# Patient Record
Sex: Male | Born: 1950 | Race: White | Hispanic: No | Marital: Married | Smoking: Former smoker
Health system: Southern US, Community
[De-identification: ages and names within clinical notes are randomized; demographics above are authoritative.]

## PROBLEM LIST (undated history)

## (undated) DIAGNOSIS — K5190 Ulcerative colitis, unspecified, without complications: Secondary | ICD-10-CM

## (undated) DIAGNOSIS — E119 Type 2 diabetes mellitus without complications: Secondary | ICD-10-CM

## (undated) DIAGNOSIS — K219 Gastro-esophageal reflux disease without esophagitis: Secondary | ICD-10-CM

## (undated) DIAGNOSIS — K766 Portal hypertension: Secondary | ICD-10-CM

## (undated) DIAGNOSIS — K7460 Unspecified cirrhosis of liver: Secondary | ICD-10-CM

## (undated) DIAGNOSIS — I509 Heart failure, unspecified: Secondary | ICD-10-CM

## (undated) DIAGNOSIS — I8500 Esophageal varices without bleeding: Secondary | ICD-10-CM

## (undated) HISTORY — PX: COLON SURGERY: SHX602

## (undated) HISTORY — PX: TOTAL COLECTOMY: SHX852

## (undated) HISTORY — PX: HERNIA REPAIR: SHX51

---

## 2021-04-17 DIAGNOSIS — E1165 Type 2 diabetes mellitus with hyperglycemia: Secondary | ICD-10-CM | POA: Diagnosis present

## 2021-04-17 DIAGNOSIS — N183 Chronic kidney disease, stage 3 unspecified: Secondary | ICD-10-CM | POA: Diagnosis present

## 2021-04-17 DIAGNOSIS — I10 Essential (primary) hypertension: Secondary | ICD-10-CM | POA: Diagnosis present

## 2021-04-17 DIAGNOSIS — N1831 Chronic kidney disease, stage 3a: Secondary | ICD-10-CM | POA: Diagnosis present

## 2021-04-17 DIAGNOSIS — Z8719 Personal history of other diseases of the digestive system: Secondary | ICD-10-CM | POA: Insufficient documentation

## 2021-04-17 DIAGNOSIS — E1142 Type 2 diabetes mellitus with diabetic polyneuropathy: Secondary | ICD-10-CM | POA: Diagnosis present

## 2021-06-08 ENCOUNTER — Emergency Department: Payer: Medicare Other

## 2021-06-08 ENCOUNTER — Other Ambulatory Visit: Payer: Self-pay

## 2021-06-08 ENCOUNTER — Inpatient Hospital Stay
Admission: EM | Admit: 2021-06-08 | Discharge: 2021-06-11 | DRG: 199 | Disposition: A | Discharge status: Home / Self Care | Payer: Medicare Other | Attending: Internal Medicine | Admitting: Internal Medicine

## 2021-06-08 ENCOUNTER — Inpatient Hospital Stay: Payer: Medicare Other

## 2021-06-08 DIAGNOSIS — N1831 Chronic kidney disease, stage 3a: Secondary | ICD-10-CM | POA: Diagnosis present

## 2021-06-08 DIAGNOSIS — Z794 Long term (current) use of insulin: Secondary | ICD-10-CM | POA: Diagnosis not present

## 2021-06-08 DIAGNOSIS — Z932 Ileostomy status: Secondary | ICD-10-CM

## 2021-06-08 DIAGNOSIS — J9601 Acute respiratory failure with hypoxia: Secondary | ICD-10-CM | POA: Diagnosis present

## 2021-06-08 DIAGNOSIS — E1122 Type 2 diabetes mellitus with diabetic chronic kidney disease: Secondary | ICD-10-CM | POA: Diagnosis present

## 2021-06-08 DIAGNOSIS — Z20822 Contact with and (suspected) exposure to covid-19: Secondary | ICD-10-CM | POA: Diagnosis present

## 2021-06-08 DIAGNOSIS — R0602 Shortness of breath: Secondary | ICD-10-CM | POA: Diagnosis present

## 2021-06-08 DIAGNOSIS — Z79899 Other long term (current) drug therapy: Secondary | ICD-10-CM | POA: Diagnosis not present

## 2021-06-08 DIAGNOSIS — E1165 Type 2 diabetes mellitus with hyperglycemia: Secondary | ICD-10-CM | POA: Diagnosis present

## 2021-06-08 DIAGNOSIS — E1142 Type 2 diabetes mellitus with diabetic polyneuropathy: Secondary | ICD-10-CM | POA: Diagnosis present

## 2021-06-08 DIAGNOSIS — J939 Pneumothorax, unspecified: Secondary | ICD-10-CM

## 2021-06-08 DIAGNOSIS — K5190 Ulcerative colitis, unspecified, without complications: Secondary | ICD-10-CM | POA: Diagnosis present

## 2021-06-08 DIAGNOSIS — N183 Chronic kidney disease, stage 3 unspecified: Secondary | ICD-10-CM | POA: Diagnosis present

## 2021-06-08 DIAGNOSIS — J9811 Atelectasis: Secondary | ICD-10-CM | POA: Diagnosis present

## 2021-06-08 DIAGNOSIS — J9311 Primary spontaneous pneumothorax: Principal | ICD-10-CM | POA: Diagnosis present

## 2021-06-08 DIAGNOSIS — I10 Essential (primary) hypertension: Secondary | ICD-10-CM | POA: Diagnosis present

## 2021-06-08 DIAGNOSIS — I129 Hypertensive chronic kidney disease with stage 1 through stage 4 chronic kidney disease, or unspecified chronic kidney disease: Secondary | ICD-10-CM | POA: Diagnosis present

## 2021-06-08 DIAGNOSIS — Z888 Allergy status to other drugs, medicaments and biological substances status: Secondary | ICD-10-CM | POA: Diagnosis not present

## 2021-06-08 DIAGNOSIS — D696 Thrombocytopenia, unspecified: Secondary | ICD-10-CM | POA: Diagnosis present

## 2021-06-08 DIAGNOSIS — Z91041 Radiographic dye allergy status: Secondary | ICD-10-CM

## 2021-06-08 DIAGNOSIS — J9691 Respiratory failure, unspecified with hypoxia: Secondary | ICD-10-CM

## 2021-06-08 HISTORY — DX: Heart failure, unspecified: I50.9

## 2021-06-08 HISTORY — DX: Type 2 diabetes mellitus without complications: E11.9

## 2021-06-08 LAB — CBC
HCT: 40.3 % (ref 39.0–52.0)
Hemoglobin: 14.1 g/dL (ref 13.0–17.0)
MCH: 31.9 pg (ref 26.0–34.0)
MCHC: 35 g/dL (ref 30.0–36.0)
MCV: 91.2 fL (ref 80.0–100.0)
Platelets: 123 10*3/uL — ABNORMAL LOW (ref 150–400)
RBC: 4.42 MIL/uL (ref 4.22–5.81)
RDW: 13 % (ref 11.5–15.5)
WBC: 7.4 10*3/uL (ref 4.0–10.5)
nRBC: 0 % (ref 0.0–0.2)

## 2021-06-08 LAB — BASIC METABOLIC PANEL
Anion gap: 7 (ref 5–15)
BUN: 21 mg/dL (ref 8–23)
CO2: 26 mmol/L (ref 22–32)
Calcium: 9.8 mg/dL (ref 8.9–10.3)
Chloride: 105 mmol/L (ref 98–111)
Creatinine, Ser: 1.39 mg/dL — ABNORMAL HIGH (ref 0.61–1.24)
GFR, Estimated: 55 mL/min — ABNORMAL LOW (ref >60)
Glucose, Bld: 254 mg/dL — ABNORMAL HIGH (ref 70–99)
Potassium: 4 mmol/L (ref 3.5–5.1)
Sodium: 138 mmol/L (ref 135–145)

## 2021-06-08 LAB — RESP PANEL BY RT-PCR (FLU A&B, COVID) ARPGX2
Influenza A by PCR: NEGATIVE
Influenza B by PCR: NEGATIVE
SARS Coronavirus 2 by RT PCR: NEGATIVE

## 2021-06-08 LAB — CBG MONITORING, ED
Glucose-Capillary: 133 mg/dL — ABNORMAL HIGH (ref 70–99)
Glucose-Capillary: 63 mg/dL — ABNORMAL LOW (ref 70–99)
Glucose-Capillary: 269 mg/dL — ABNORMAL HIGH (ref 70–99)

## 2021-06-08 MED ORDER — ACETAMINOPHEN 325 MG PO TABS: 650.0000 mg | ORAL_TABLET | Freq: Four times a day (QID) | ORAL | Status: DC | PRN

## 2021-06-08 MED ORDER — INSULIN ASPART 100 UNIT/ML IJ SOLN
0.0000 [IU] | Freq: Three times a day (TID) | INTRAMUSCULAR | Status: DC
  Administered 2021-06-09: 3 [IU] via SUBCUTANEOUS
  Administered 2021-06-10 (×2): 2 [IU] via SUBCUTANEOUS
  Administered 2021-06-10: 5 [IU] via SUBCUTANEOUS
  Administered 2021-06-11: 2 [IU] via SUBCUTANEOUS
  Administered 2021-06-11: 5 [IU] via SUBCUTANEOUS
  Filled 2021-06-08 (×6): qty 1

## 2021-06-08 MED ORDER — LABETALOL HCL 5 MG/ML IV SOLN: 10.0000 mg | INTRAVENOUS | Status: DC | PRN

## 2021-06-08 MED ORDER — INSULIN ASPART 100 UNIT/ML IJ SOLN
0.0000 [IU] | Freq: Every day | INTRAMUSCULAR | Status: DC
Start: 2021-06-08 — End: 2021-06-11
  Administered 2021-06-10: 2 [IU] via SUBCUTANEOUS
  Filled 2021-06-08: qty 1

## 2021-06-08 MED ORDER — MORPHINE SULFATE (PF) 2 MG/ML IV SOLN
2.0000 mg | INTRAVENOUS | Status: DC | PRN
Start: 2021-06-08 — End: 2021-06-09
  Administered 2021-06-08: 2 mg via INTRAVENOUS
  Filled 2021-06-08: qty 1

## 2021-06-08 MED ORDER — ENOXAPARIN SODIUM 40 MG/0.4ML IJ SOSY: 40.0000 mg | PREFILLED_SYRINGE | INTRAMUSCULAR | Status: DC

## 2021-06-08 MED ORDER — ACETAMINOPHEN 650 MG RE SUPP: 650.0000 mg | Freq: Four times a day (QID) | RECTAL | Status: DC | PRN

## 2021-06-08 MED ORDER — POLYETHYLENE GLYCOL 3350 17 G PO PACK: 17.0000 g | PACK | Freq: Every day | ORAL | Status: DC | PRN

## 2021-06-08 NOTE — ED Notes (Signed)
Pt with c/o feeling "shaky" and his blood sugar is low. BG checked, bg 63. Pt alert, oriented. Pt given 8 oz of orange juice and 3 packets of sugar. Will continue to assess.

## 2021-06-08 NOTE — ED Triage Notes (Signed)
Pt to ED for shob with exertion x1 week with cough. States sent by doctor for abnormal x ray. Speaking in complete sentences. Denies chest pain

## 2021-06-08 NOTE — ED Provider Notes (Signed)
[facility] Emergency Department Provider Note   ____________________________________________   Event Date/Time   First MD Initiated Contact with Patient 06/08/21 1629     (approximate)  I have reviewed the triage vital signs and the nursing notes.   HISTORY  Chief Complaint Shortness of Breath    HPI Alexander Cunningham is a 70 y.o. 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, type 2 diabetes, and hypertension          Past Medical History:  Diagnosis Date   CHF (congestive heart failure) (HCC)    Diabetes mellitus without complication (HCC)     Patient Active Problem List   Diagnosis Date Noted   Pneumothorax on right 06/08/2021   Ulcerative colitis (HCC) 06/08/2021   Respiratory failure with hypoxia (HCC) 06/08/2021   Pneumothorax 06/08/2021   Stage 3 chronic kidney disease (HCC) 04/17/2021   History of colitis 04/17/2021   Essential hypertension 04/17/2021   Type 2 diabetes mellitus with peripheral neuropathy (HCC) 04/17/2021     Prior to Admission medications   Medication Sig Start Date End Date Taking? Authorizing Provider  ferrous sulfate 325 (65 FE) MG tablet Take 325 mg by mouth every morning. 01/04/21  Yes [provider]  gabapentin (NEURONTIN) 300 MG capsule Take 300 mg by mouth 3 (three) times daily. 05/16/21  Yes [provider]  Insulin Glargine (BASAGLAR KWIKPEN) 100 UNIT/ML Inject 25 Units into the skin in the morning and at bedtime. 72 units in the am and 25 units in the pm 04/06/21  Yes [provider]  lisinopril (ZESTRIL) 5 MG tablet Take 5 mg by mouth every other day. 04/17/21  Yes [provider]  magnesium oxide (MAG-OX) 400 MG tablet Take 1 tablet by mouth 2 (two) times daily. 05/03/21  Yes [provider]  NOVOLOG FLEXPEN 100 UNIT/ML FlexPen Inject 15 Units into the skin in the morning, at noon, and at bedtime. 04/06/21  Yes [provider]  omeprazole (PRILOSEC) 40 MG capsule Take 40 mg by mouth daily. 05/26/21  Yes [provider]  propranolol (INDERAL) 10 MG tablet Take 10 mg by mouth 2 (two) times daily. 05/14/21  Yes [provider]    Allergies Contrast media [iodinated diagnostic agents] and Tricor [fenofibrate]  No family history on file.  Social History    Review of Systems Constitutional: No fever/chills Eyes: No visual changes. ENT: No sore throat. Cardiovascular: Denies chest pain. Respiratory: Endorses shortness of breath and nonproductive cough Gastrointestinal: No abdominal pain.  No nausea, no vomiting.  No diarrhea. Genitourinary: Negative for dysuria. Musculoskeletal: Negative for acute arthralgias Skin: Negative for rash. Neurological: Negative for headaches, weakness/numbness/paresthesias in any extremity Psychiatric: Negative for suicidal ideation/homicidal ideation   ____________________________________________   PHYSICAL EXAM:  VITAL SIGNS: ED Triage Vitals  Enc Vitals Group     BP 06/08/21 1426 (!) 159/77     Pulse Rate 06/08/21 1426 64     Resp  06/08/21 1426 20     Temp 06/08/21 1426 97.7 F (36.5 C)     Temp Source 06/08/21 1426 Oral     SpO2 06/08/21 1426 97 %     Weight 06/08/21 1420 175 lb (79.4 kg)     Height 06/08/21 1420 5\' 10"  (1.778 m)     Head Circumference --      Peak Flow --       Pain Score 06/08/21 1420 0     Pain Loc --      Pain Edu? --      Excl. in GC? --    Constitutional: Alert and oriented. Well appearing and in no acute distress. Eyes: Conjunctivae are normal. PERRL. Head: Atraumatic. Nose: No congestion/rhinnorhea. Mouth/Throat: Mucous membranes are moist. Neck: No stridor Cardiovascular: Grossly normal heart sounds.  Good peripheral circulation. Respiratory: Normal respiratory effort.  No retractions.  Distant breath sounds over right lower lung fields Gastrointestinal: Soft and nontender. No distention. Musculoskeletal: No obvious deformities Neurologic:  Normal speech and language. No gross focal neurologic deficits are appreciated. Skin:  Skin is warm and dry. No rash noted. Psychiatric: Mood and affect are normal. Speech and behavior are normal.  ____________________________________________   LABS (all labs ordered are listed, but only abnormal results are displayed)  Labs Reviewed  CBC - Abnormal; Notable for the following components:      Result Value   Platelets 123 (*)    All other components within normal limits  BASIC METABOLIC PANEL - Abnormal; Notable for the following components:   Glucose, Bld 254 (*)    Creatinine, Ser 1.39 (*)    GFR, Estimated 55 (*)    All other components within normal limits  CBG MONITORING, ED - Abnormal; Notable for the following components:   Glucose-Capillary 63 (*)    All other components within normal limits  CBG MONITORING, ED - Abnormal; Notable for the following components:   Glucose-Capillary 133 (*)    All other components within normal limits  CBG MONITORING, ED - Abnormal; Notable for the following components:   Glucose-Capillary 269 (*)    All other components within normal limits  RESP PANEL BY RT-PCR (FLU A&B, COVID) ARPGX2  HIV ANTIBODY (ROUTINE TESTING W REFLEX)  BASIC METABOLIC PANEL  CBC   ____________________________________________  EKG  ED ECG REPORT I, 08/08/21,  the attending physician, personally viewed and interpreted this ECG.  Date: 06/08/2021 EKG Time: 1417 Rate: 63 Rhythm: normal sinus rhythm QRS Axis: normal Intervals: normal ST/T Wave abnormalities: normal Narrative Interpretation: no evidence of acute ischemia  ____________________________________________  RADIOLOGY  ED MD interpretation: 2 view chest x-ray shows moderate right pneumothorax read as 50% without any displaced rib fracture or obvious etiology  Official radiology report(s): DG Chest 2 View  Result Date: 06/08/2021 CLINICAL DATA:  Shortness of breath EXAM: CHEST - 2 VIEW COMPARISON:  None. FINDINGS: Moderate right pneumothorax, approximately 50% volume. The left lung is normally aerated. Mild cardiomegaly. Osseous structures are unremarkable. IMPRESSION: Moderate right pneumothorax, approximately 50% volume. No displaced rib fracture or other obvious etiology. Call report request was placed at the time of interpretation. Final communication will be documented. Electronically Signed   By: 08/08/2021 M.D.   On: 06/08/2021 15:03    ____________________________________________   PROCEDURES  Procedure(s) performed (including Critical Care):  CHEST TUBE INSERTION  Date/Time: 06/08/2021 10:24 PM Performed by: 08/08/2021, MD Authorized by: Merwyn Katos, MD   Consent:    Consent obtained:  Verbal   Consent given by:  Patient   Risks, benefits, and alternatives were discussed: yes     Risks discussed:  Bleeding, damage to surrounding structures, incomplete drainage, infection, nerve damage and pain   Alternatives discussed:  No treatment, delayed treatment, alternative treatment and observation Universal protocol:    Immediately prior to procedure, a time out was called: yes     Patient identity confirmed:  Verbally with patient and arm band Pre-procedure details:    Skin preparation:  Chlorhexidine   Preparation: Patient was prepped and draped in the usual  sterile fashion   Sedation:    Sedation type:  None Anesthesia:    Anesthesia method:  Local infiltration   Local anesthetic:  Lidocaine 1% w/o epi Procedure details:    Placement location:  R lateral   Scalpel size:  11   Tube size (Fr):  16   Tension pneumothorax: no     Tube connected to:  Water seal   Drainage characteristics:  Air only   Suture material:  0 silk   Dressing:  4x4 sterile gauze and petrolatum-impregnated gauze Post-procedure details:    Post-insertion x-ray findings: tube in good position     Procedure completion:  Tolerated well, no immediate complications .1-3 Lead EKG Interpretation Performed by: Merwyn Katos, MD Authorized by: Merwyn Katos, MD     Interpretation: normal     ECG rate:  78   ECG rate assessment: normal     Rhythm: sinus rhythm     Ectopy: none     Conduction: normal     ____________________________________________   INITIAL IMPRESSION / ASSESSMENT AND PLAN / ED COURSE  As part of my medical decision making, I reviewed the following data within the electronic medical record, if available:  Nursing notes reviewed and incorporated, Labs reviewed, EKG interpreted, Old chart reviewed, Radiograph reviewed and Notes from prior ED visits reviewed and incorporated     Patient is a 70 year old male with the above-stated past medical history presents for shortness of breath as well as dyspnea on exertion Patient's finding consistent with no clinical instability and pneumothorax on the right seen on chest x-ray without any signs of midline shift that would suggest a tension pneumothorax.  Consults: Pulmonology-spoke with Dr. Meredeth Ide who recommends high flow nasal cannula as well as admission to the stepdown unit and placement of a pigtail chest tube if patient's symptoms worsen at any time as well as recommended talking to the on-call general surgeon for placement of this chest tube if necessary. General surgery-spoke with Dr. Aleen Campi who  agreed to place chest tube if necessary and will be on the case Hospitalist-I spoke to Dr. Renford Dills in internal medicine who agrees to accept this patient onto their service as long as pulmonology is following and will give recommendations.  After pulmonology was reconsulted, and the decision was made by the inpatient team to place a chest tube.  I was asked by Dr. Aleen Campi to place this tube in the emergency department.  Please refer to procedure note for full details  Dispo: Admit to medicine     ____________________________________________   FINAL CLINICAL IMPRESSION(S) / ED DIAGNOSES  Final diagnoses:  Primary spontaneous pneumothorax  SOB (shortness of breath)     ED Discharge Orders     None        Note:  This document was prepared using Dragon voice recognition software and may include unintentional dictation errors.    Merwyn Katos, MD 06/08/21  2225  

## 2021-06-08 NOTE — ED Notes (Signed)
BGL 133. Boxed lunch provided to patient by RN.

## 2021-06-08 NOTE — ED Notes (Signed)
Dr. Vicente Males at bedside, chest tube inserted by MD. CT to water seal per MD. CXR ordered. VSS.

## 2021-06-08 NOTE — ED Notes (Signed)
This RN with IV insertion x 2 without success, will get another RN to attempt.

## 2021-06-08 NOTE — ED Notes (Signed)
Per Dr Vicente Males, place pt on O2 3 L [place]. Pt tolerating well.

## 2021-06-08 NOTE — ED Notes (Signed)
ED RN's with IV attempts x 4 without success, IV team order placed.

## 2021-06-08 NOTE — H&P (Addendum)
History and Physical    Alexander Cunningham WSF:[id number] DOB: 05-Nov-1950 DOA: 06/08/2021  PCP: No primary care provider on file.   Patient coming from: Home    Chief Complaint: Shortness of breath, cough  HPI: Alexander Cunningham is a 70 y.o. male with medical history significant of diabetes type 2, hypertension, ulcerative colitis status post colectomy/ ileostomy who presented from home with complaints of worsening shortness of breath, cough.  Patient lives with his wife, ambulatory and is self dependent on daily activities.  He reports that he walks about 2 miles a day.  About a week ago, he started developing progressive shortness of breath on exertion.  Yesterday morning, he woke up with severe shortness of breath and cough.  He went to see his primary care physician.  Patient was sent to the emergency department for x-ray of the chest.  Chest x-ray done in the emergency room showed right-sided pneumothorax about 50% in size.  No history of COPD or asthma.  He was a prior smoker. Patient seen and examined at the bedside this afternoon.  He was comfortable during my evaluation.  He was not in any kind of respiratory distress.  He was on 2 L of oxygen per minute.  Wife at the bedside. He denies any worsening shortness of breath, cough during my evaluation.  He denies any fever, chills, chest pain, nausea, vomiting, diarrhea, headache, hematochezia or melena.  ED Course: Chest x-ray as above.  Remained mildly hypertensive throughout his stay in the emergency department.  Lab work showed creatinine of 1.39.  Platelets of 123.  Patient being admitted for the management of right-sided moderate pneumothorax.  Pulmonology consulted with plan for chest tube placement.  Review of Systems: As per HPI otherwise 10 point review of systems negative.    Past Medical History:  Diagnosis Date   CHF (congestive heart failure) (HCC)    Diabetes mellitus without complication (HCC)      has no history on file  for tobacco use, alcohol use, and drug use.  Allergies  Allergen Reactions   Contrast Media [Iodinated Diagnostic Agents] Rash   Tricor [Fenofibrate] Rash    No family history on file.   Prior to Admission medications   Not on File    Physical Exam: Vitals:   06/08/21 1712 06/08/21 1718 06/08/21 1718 06/08/21 1730  BP: (!) 174/79  (!) 174/79 (!) 170/80  Pulse: (!) 58 62 (!) 59 61  Resp:  16 15 (!) 21  Temp:      TempSrc:      SpO2: 100% 98% 97% 100%  Weight:      Height:        Constitutional: NAD, calm, comfortable,pleasant male Vitals:   06/08/21 1712 06/08/21 1718 06/08/21 1718 06/08/21 1730  BP: (!) 174/79  (!) 174/79 (!) 170/80  Pulse: (!) 58 62 (!) 59 61  Resp:  16 15 (!) 21  Temp:      TempSrc:      SpO2: 100% 98% 97% 100%  Weight:      Height:       Eyes: PERRL, lids and conjunctivae normal ENMT: Mucous membranes are moist.  Neck: normal, supple, no masses, no thyromegaly Respiratory: Diminished air entry on the right side, no wheezing, no crackles. Normal respiratory effort. No accessory muscle use.  Cardiovascular: Regular rate and rhythm, no murmurs / rubs / gallops. No extremity edema.  Abdomen: no tenderness, no masses palpated. No hepatosplenomegaly. Bowel sounds positive.  Ileostomy Musculoskeletal: no clubbing /  cyanosis. No joint deformity upper and lower extremities.  Skin: no rashes, lesions, ulcers. No induration Neurologic: CN 2-12 grossly intact.  Strength 5/5 in all 4.  Psychiatric: Normal judgment and insight. Alert and oriented x 3. Normal mood.   Foley Catheter:None  Labs on Admission: I have personally reviewed following labs and imaging studies  CBC: Recent Labs  Lab 06/08/21 1426  WBC 7.4  HGB 14.1  HCT 40.3  MCV 91.2  PLT 123*   Basic Metabolic Panel: Recent Labs  Lab 06/08/21 1426  NA 138  K 4.0  CL 105  CO2 26  GLUCOSE 254*  BUN 21  CREATININE 1.39*  CALCIUM 9.8   GFR: Estimated Creatinine Clearance: 51.1  mL/min (A) (by C-G formula based on SCr of 1.39 mg/dL (H)). Liver Function Tests: No results for input(s): AST, ALT, ALKPHOS, BILITOT, PROT, ALBUMIN in the last 168 hours. No results for input(s): LIPASE, AMYLASE in the last 168 hours. No results for input(s): AMMONIA in the last 168 hours. Coagulation Profile: No results for input(s): INR, PROTIME in the last 168 hours. Cardiac Enzymes: No results for input(s): CKTOTAL, CKMB, CKMBINDEX, TROPONINI in the last 168 hours. BNP (last 3 results) No results for input(s): PROBNP in the last 8760 hours. HbA1C: No results for input(s): HGBA1C in the last 72 hours. CBG: No results for input(s): GLUCAP in the last 168 hours. Lipid Profile: No results for input(s): CHOL, HDL, LDLCALC, TRIG, CHOLHDL, LDLDIRECT in the last 72 hours. Thyroid Function Tests: No results for input(s): TSH, T4TOTAL, FREET4, T3FREE, THYROIDAB in the last 72 hours. Anemia Panel: No results for input(s): VITAMINB12, FOLATE, FERRITIN, TIBC, IRON, RETICCTPCT in the last 72 hours. Urine analysis: No results found for: COLORURINE, APPEARANCEUR, LABSPEC, PHURINE, GLUCOSEU, HGBUR, BILIRUBINUR, KETONESUR, PROTEINUR, UROBILINOGEN, NITRITE, LEUKOCYTESUR  Radiological Exams on Admission: DG Chest 2 View  Result Date: 06/08/2021 CLINICAL DATA:  Shortness of breath EXAM: CHEST - 2 VIEW COMPARISON:  None. FINDINGS: Moderate right pneumothorax, approximately 50% volume. The left lung is normally aerated. Mild cardiomegaly. Osseous structures are unremarkable. IMPRESSION: Moderate right pneumothorax, approximately 50% volume. No displaced rib fracture or other obvious etiology. Call report request was placed at the time of interpretation. Final communication will be documented. Electronically Signed   By: Lauralyn Primes M.D.   On: 06/08/2021 15:03     Assessment/Plan Principal Problem:   Pneumothorax on right Active Problems:   Stage 3 chronic kidney disease (HCC)   Essential  hypertension   Type 2 diabetes mellitus with peripheral neuropathy (HCC)   Ulcerative colitis (HCC)   Respiratory failure with hypoxia (HCC)   Pneumothorax   Moderate right-sided pneumothorax: Presented with progressive worsening of shortness of breath on exertion, worsening cough. No history of COPD/emphysema.  He is a past smoker.  No active lung disease. Chest x-ray showed moderate pneumothorax approximately with 50% of volume. Needs chest tube placement.  PCCM already on board.  Planning for chest tube placement tonight.  I talked with Dr. Meredeth Ide, PCCM, and he is aware.General surgery will put the chest tube if PCCM not available for chest tube placement tonight.  Acute hypoxic respiratory failure: Secondary to pneumothorax.  Currently on 2 L of oxygen .  He is not on oxygen at home.  We will try to wean the oxygen.  Hypertension: Remains hypertensive in the emergency department.  Continue as needed medications for now.  Medications have not been reconciled yet.  We will restart the home medications after medications are reconciled.  Diabetes  type 2: Continue sliding scale insulin.  Medication reconciliation pending.  Monitor blood sugars.  Hemoglobin A1c of 7.7 as per 04/1871  History of ulcerative colitis: History of colectomy in 2016.  Currently on colostomy.  Follows with gastroenterology.  Currently in remission.  Stage IIIa CKD: Currently kidney function at baseline.  Baseline creatinine around 1.4  Thrombocytopenia: Likely chronic, upon reviewing his previous blood works.  Continue to monitor.  Stable.  Medication reconciliation for this patient has not been completed yet.  Will restart home medications after it is complete   Severity of Illness: The appropriate patient status for this patient is INPATIENT.   DVT prophylaxis: Lovenox Code Status:Full  Family Communication: Discussed with wife at bed side Consults called: PCCM(Dr Meredeth Ide), general surgery     Burnadette Pop MD [facility]  06/08/2021, 5:54 PM

## 2021-06-09 ENCOUNTER — Inpatient Hospital Stay: Payer: Medicare Other

## 2021-06-09 DIAGNOSIS — J9311 Primary spontaneous pneumothorax: Principal | ICD-10-CM

## 2021-06-09 LAB — CBC
HCT: 39.2 % (ref 39.0–52.0)
Hemoglobin: 13.4 g/dL (ref 13.0–17.0)
MCH: 31.2 pg (ref 26.0–34.0)
MCHC: 34.2 g/dL (ref 30.0–36.0)
MCV: 91.4 fL (ref 80.0–100.0)
Platelets: 97 10*3/uL — ABNORMAL LOW (ref 150–400)
RBC: 4.29 MIL/uL (ref 4.22–5.81)
RDW: 13 % (ref 11.5–15.5)
WBC: 7.4 10*3/uL (ref 4.0–10.5)
nRBC: 0 % (ref 0.0–0.2)

## 2021-06-09 LAB — CBG MONITORING, ED
Glucose-Capillary: 155 mg/dL — ABNORMAL HIGH (ref 70–99)
Glucose-Capillary: 151 mg/dL — ABNORMAL HIGH (ref 70–99)
Glucose-Capillary: 235 mg/dL — ABNORMAL HIGH (ref 70–99)
Glucose-Capillary: 278 mg/dL — ABNORMAL HIGH (ref 70–99)

## 2021-06-09 LAB — HIV ANTIBODY (ROUTINE TESTING W REFLEX): HIV Screen 4th Generation wRfx: NONREACTIVE

## 2021-06-09 LAB — BASIC METABOLIC PANEL
Anion gap: 5 (ref 5–15)
BUN: 23 mg/dL (ref 8–23)
CO2: 25 mmol/L (ref 22–32)
Calcium: 9.9 mg/dL (ref 8.9–10.3)
Chloride: 107 mmol/L (ref 98–111)
Creatinine, Ser: 1.67 mg/dL — ABNORMAL HIGH (ref 0.61–1.24)
GFR, Estimated: 44 mL/min — ABNORMAL LOW (ref >60)
Glucose, Bld: 171 mg/dL — ABNORMAL HIGH (ref 70–99)
Potassium: 4.8 mmol/L (ref 3.5–5.1)
Sodium: 137 mmol/L (ref 135–145)

## 2021-06-09 MED ORDER — GABAPENTIN 300 MG PO CAPS
300.0000 mg | ORAL_CAPSULE | Freq: Three times a day (TID) | ORAL | Status: DC
  Administered 2021-06-09 – 2021-06-10 (×3): 300 mg via ORAL
  Filled 2021-06-09 (×6): qty 1

## 2021-06-09 MED ORDER — OMEPRAZOLE 20 MG PO CPDR
40.0000 mg | DELAYED_RELEASE_CAPSULE | Freq: Every day | ORAL | Status: DC
  Filled 2021-06-09 (×2): qty 2

## 2021-06-09 MED ORDER — PROPRANOLOL HCL 10 MG PO TABS
10.0000 mg | ORAL_TABLET | Freq: Two times a day (BID) | ORAL | Status: DC
  Administered 2021-06-09 – 2021-06-11 (×5): 10 mg via ORAL
  Filled 2021-06-09 (×6): qty 1

## 2021-06-09 MED ORDER — HYDROMORPHONE HCL 1 MG/ML IJ SOLN
0.5000 mg | INTRAMUSCULAR | Status: DC | PRN
Start: 2021-06-09 — End: 2021-06-11
  Administered 2021-06-09 (×3): 0.5 mg via INTRAVENOUS
  Filled 2021-06-09 (×3): qty 1

## 2021-06-09 MED ORDER — NON FORMULARY: 40.0000 mg | Freq: Every day | Status: DC

## 2021-06-09 MED ORDER — ACETAMINOPHEN 500 MG PO TABS
1000.0000 mg | ORAL_TABLET | Freq: Four times a day (QID) | ORAL | Status: DC
  Administered 2021-06-09 – 2021-06-11 (×9): 1000 mg via ORAL
  Filled 2021-06-09 (×11): qty 2

## 2021-06-09 MED ORDER — FERROUS SULFATE 325 (65 FE) MG PO TABS
325.0000 mg | ORAL_TABLET | Freq: Every morning | ORAL | Status: DC
  Filled 2021-06-09 (×2): qty 1

## 2021-06-09 MED ORDER — PANTOPRAZOLE SODIUM 40 MG PO TBEC
40.0000 mg | DELAYED_RELEASE_TABLET | Freq: Every day | ORAL | Status: DC
  Filled 2021-06-09: qty 1

## 2021-06-09 MED ORDER — OXYCODONE HCL 5 MG PO TABS
5.0000 mg | ORAL_TABLET | ORAL | Status: DC | PRN
  Administered 2021-06-09: 5 mg via ORAL
  Administered 2021-06-10: 10 mg via ORAL
  Administered 2021-06-10 – 2021-06-11 (×3): 5 mg via ORAL
  Administered 2021-06-11: 10 mg via ORAL
  Filled 2021-06-09 (×2): qty 1
  Filled 2021-06-09 (×2): qty 2
  Filled 2021-06-09 (×2): qty 1

## 2021-06-09 MED ORDER — KETOROLAC TROMETHAMINE 30 MG/ML IJ SOLN
15.0000 mg | Freq: Four times a day (QID) | INTRAMUSCULAR | Status: DC
  Administered 2021-06-09 – 2021-06-10 (×6): 15 mg via INTRAVENOUS
  Filled 2021-06-09 (×6): qty 1

## 2021-06-09 MED ORDER — LISINOPRIL 10 MG PO TABS: 5.0000 mg | ORAL_TABLET | ORAL | Status: DC

## 2021-06-09 MED ORDER — MAGNESIUM OXIDE 400 MG PO TABS
400.0000 mg | ORAL_TABLET | Freq: Two times a day (BID) | ORAL | Status: DC
  Administered 2021-06-09 – 2021-06-10 (×4): 400 mg via ORAL
  Filled 2021-06-09 (×8): qty 1

## 2021-06-09 NOTE — ED Notes (Signed)
Patient irritated and requesting to order lunch and wondering when it will arrive. Patient expresses being upset about not having phone in room to order food. Patient given staff phone to order lunch and after speaking with dining services he stated to them " I do not think I am going to eat lunch." Patient then ordered crackers and cheese.

## 2021-06-09 NOTE — ED Notes (Signed)
Pt asleep in semi fowlers position, call light in reach. Pt does not appear to be in any apparent acute resp distress at this time.

## 2021-06-09 NOTE — Progress Notes (Signed)
PROGRESS NOTE    Alexander Cunningham  FIE:[id number] DOB: August 10, 1951 DOA: 06/08/2021 PCP: Dorothey BasemanBronstein, David, MD   Chief Complain:SOB  Brief Narrative:  Alexander Cunningham is a 70 y.o. male with medical history significant of diabetes type 2, hypertension, ulcerative colitis status post colectomy/ ileostomy who presented from home with complaints of worsening shortness of breath, cough.   he woke up a day before admission with severe shortness of breath and cough.  He went to see his primary care physician.  Patient was sent to the emergency department for x-ray of the chest.  Chest x-ray done in the emergency room showed right-sided pneumothorax about 50% in size.  Chest tube was inserted on the right side in the emergency department.  PCCM following.  Assessment & Plan:   Principal Problem:   Pneumothorax on right Active Problems:   Stage 3 chronic kidney disease (HCC)   Essential hypertension   Type 2 diabetes mellitus with peripheral neuropathy (HCC)   Ulcerative colitis (HCC)   Respiratory failure with hypoxia (HCC)   Pneumothorax   Moderate right-sided pneumothorax: Presented with progressive worsening of shortness of breath on exertion, worsening cough. No history of COPD/emphysema.  He is a past smoker.  No active lung disease. Chest x-ray showed moderate pneumothorax approximately with 50% of volume. Underwent  chest tube placement.  PCCM already on board.  Follow-up chest x-ray showed significant improvement in the pneumothorax.  Currently chest tube on suction.  Plan for follow-up chest x-ray tomorrow Auscultation revealed very good return of air entry on the right side  Acute hypoxic respiratory failure: resolved. Required  2 L of oxygen on presentation, on room air now  Hypertension: Continue his home medications.  Monitor blood pressure   Diabetes type 2: Continue sliding scale insulin.  Takes insulin at home. Hemoglobin A1c of 7.7 as per 04/17/21.  Monitor blood sugars    History of ulcerative colitis: History of colectomy in 2016.  Currently on colostomy.  Follows with gastroenterology.  Currently in remission.   Stage IIIa CKD:  Baseline creatinine around 1.4.  Creatinine bumped up today.  Check BMP tomorrow   Thrombocytopenia: Likely chronic, upon reviewing his previous blood works.  Continue to monitor.  Stable.           DVT prophylaxis:Lovenox Code Status: Full Family Communication: Wife at bedside on 06/08/21 Status is: Inpatient  Remains inpatient appropriate because:Unsafe d/c plan  Dispo: The patient is from: Home              Anticipated d/c is to: Home              Patient currently is not medically stable to d/c.   Difficult to place patient No     Consultants: General surgery, PCCM  Procedures: Chest tube placement  Antimicrobials:  Anti-infectives (From admission, onward)    None       Subjective: Patient seen and examined the bedside this morning.  Hemodynamically stable.  Chest tube was inserted last night.  Currently feels comfortable but having some pain on the chest tube insertion site.  On room air.  Denies any worsening cough or shortness of breath.  Objective: Vitals:   06/09/21 0530 06/09/21 0600 06/09/21 0730 06/09/21 0900  BP: (!) 145/76 131/67 133/67 123/78  Pulse: 65 (!) 57 (!) 55 (!) 55  Resp: 14 12 13 13   Temp:      TempSrc:      SpO2: 97% 96% 97% 99%  Weight:  Height:       No intake or output data in the 24 hours ending 06/09/21 1155 Filed Weights   06/08/21 1420  Weight: 79.4 kg    Examination:  General exam: Overall comfortable, not in distress HEENT: PERRL Respiratory system:  no wheezes or crackles , right-sided chest tube Cardiovascular system: S1 & S2 heard, RRR.  Gastrointestinal system: Abdomen is nondistended, soft and nontender. Central nervous system: Alert and oriented Extremities: No edema, no clubbing ,no cyanosis Skin: No rashes, no ulcers,no icterus     Data  Reviewed: I have personally reviewed following labs and imaging studies  CBC: Recent Labs  Lab 06/08/21 1426 06/09/21 0723  WBC 7.4 7.4  HGB 14.1 13.4  HCT 40.3 39.2  MCV 91.2 91.4  PLT 123* 97*   Basic Metabolic Panel: Recent Labs  Lab 06/08/21 1426 06/09/21 0723  NA 138 137  K 4.0 4.8  CL 105 107  CO2 26 25  GLUCOSE 254* 171*  BUN 21 23  CREATININE 1.39* 1.67*  CALCIUM 9.8 9.9   GFR: Estimated Creatinine Clearance: 42.5 mL/min (A) (by C-G formula based on SCr of 1.67 mg/dL (H)). Liver Function Tests: No results for input(s): AST, ALT, ALKPHOS, BILITOT, PROT, ALBUMIN in the last 168 hours. No results for input(s): LIPASE, AMYLASE in the last 168 hours. No results for input(s): AMMONIA in the last 168 hours. Coagulation Profile: No results for input(s): INR, PROTIME in the last 168 hours. Cardiac Enzymes: No results for input(s): CKTOTAL, CKMB, CKMBINDEX, TROPONINI in the last 168 hours. BNP (last 3 results) No results for input(s): PROBNP in the last 8760 hours. HbA1C: No results for input(s): HGBA1C in the last 72 hours. CBG: Recent Labs  Lab 06/08/21 1831 06/08/21 1917 06/08/21 2109 06/09/21 0721  GLUCAP 63* 133* 269* 155*   Lipid Profile: No results for input(s): CHOL, HDL, LDLCALC, TRIG, CHOLHDL, LDLDIRECT in the last 72 hours. Thyroid Function Tests: No results for input(s): TSH, T4TOTAL, FREET4, T3FREE, THYROIDAB in the last 72 hours. Anemia Panel: No results for input(s): VITAMINB12, FOLATE, FERRITIN, TIBC, IRON, RETICCTPCT in the last 72 hours. Sepsis Labs: No results for input(s): PROCALCITON, LATICACIDVEN in the last 168 hours.  Recent Results (from the past 240 hour(s))  Resp Panel by RT-PCR (Flu A&B, Covid) Nasopharyngeal Swab     Status: None   Collection Time: 06/08/21  5:28 PM   Specimen: Nasopharyngeal Swab; Nasopharyngeal(NP) swabs in vial transport medium  Result Value Ref Range Status   SARS Coronavirus 2 by RT PCR NEGATIVE NEGATIVE  Final    Comment: (NOTE) SARS-CoV-2 target nucleic acids are NOT DETECTED.  The SARS-CoV-2 RNA is generally detectable in upper respiratory specimens during the acute phase of infection. The lowest concentration of SARS-CoV-2 viral copies this assay can detect is 138 copies/mL. A negative result does not preclude SARS-Cov-2 infection and should not be used as the sole basis for treatment or other patient management decisions. A negative result may occur with  improper specimen collection/handling, submission of specimen other than nasopharyngeal swab, presence of viral mutation(s) within the areas targeted by this assay, and inadequate number of viral copies(<138 copies/mL). A negative result must be combined with clinical observations, patient history, and epidemiological information. The expected result is Negative.  Fact Sheet for Patients:  BloggerCourse.com  Fact Sheet for Healthcare Providers:  SeriousBroker.it  This test is no t yet approved or cleared by the [place] FDA and  has been authorized for detection and/or diagnosis of SARS-CoV-2 by  FDA under an Emergency Use Authorization (EUA). This EUA will remain  in effect (meaning this test can be used) for the duration of the COVID-19 declaration under Section 564(b)(1) of the Act, 21 U.S.C.section 360bbb-3(b)(1), unless the authorization is terminated  or revoked sooner.       Influenza A by PCR NEGATIVE NEGATIVE Final   Influenza B by PCR NEGATIVE NEGATIVE Final    Comment: (NOTE) The Xpert Xpress SARS-CoV-2/FLU/RSV plus assay is intended as an aid in the diagnosis of influenza from Nasopharyngeal swab specimens and should not be used as a sole basis for treatment. Nasal washings and aspirates are unacceptable for Xpert Xpress SARS-CoV-2/FLU/RSV testing.  Fact Sheet for Patients: BloggerCourse.com  Fact Sheet for Healthcare  Providers: SeriousBroker.it  This test is not yet approved or cleared by the [place] FDA and has been authorized for detection and/or diagnosis of SARS-CoV-2 by FDA under an Emergency Use Authorization (EUA). This EUA will remain in effect (meaning this test can be used) for the duration of the COVID-19 declaration under Section 564(b)(1) of the Act, 21 U.S.C. section 360bbb-3(b)(1), unless the authorization is terminated or revoked.  Performed at [facility], [address], [place], [place] [address]          Radiology Studies: DG Chest 2 View  Result Date: 06/08/2021 CLINICAL DATA:  Shortness of breath EXAM: CHEST - 2 VIEW COMPARISON:  None. FINDINGS: Moderate right pneumothorax, approximately 50% volume. The left lung is normally aerated. Mild cardiomegaly. Osseous structures are unremarkable. IMPRESSION: Moderate right pneumothorax, approximately 50% volume. No displaced rib fracture or other obvious etiology. Call report request was placed at the time of interpretation. Final communication will be documented. Electronically Signed   By: Lauralyn Primes M.D.   On: 06/08/2021 15:03   DG Chest Portable 1 View  Result Date: 06/09/2021 CLINICAL DATA:  Increasing shortness of breath. EXAM: PORTABLE CHEST 1 VIEW COMPARISON:  Radiograph yesterday. FINDINGS: Unchanged positioning of right pigtail catheter coiled at the right lung base laterally. Small residual pneumothorax is now visualized at the apex under the second rib posteriorly. Slight increase in right lung base atelectasis. The exam is otherwise unchanged. Stable heart size and mediastinal contours. IMPRESSION: 1. Unchanged positioning of right pigtail catheter. Small residual right pneumothorax is now visualized at the apex. 2. Slight increase in right lung base atelectasis. Electronically Signed   By: Narda Rutherford M.D.   On: 06/09/2021 01:47   DG Chest Portable 1 View  Result Date:  06/08/2021 CLINICAL DATA:  Chest tube EXAM: PORTABLE CHEST 1 VIEW COMPARISON:  06/08/2021 FINDINGS: Interval placement of right chest tube and re-expansion of the right lung. No visible residual pneumothorax. Bibasilar atelectasis. Heart is normal size. No acute bony abnormality. IMPRESSION: Interval placement of right chest tube with re-expansion of the right lung. No visible residual pneumothorax. Bibasilar atelectasis. Electronically Signed   By: Charlett Nose M.D.   On: 06/08/2021 22:51        Scheduled Meds:  acetaminophen  1,000 mg Oral Q6H   ferrous sulfate  325 mg Oral q morning   gabapentin  300 mg Oral TID   insulin aspart  0-5 Units Subcutaneous QHS   insulin aspart  0-9 Units Subcutaneous TID WC   ketorolac  15 mg Intravenous Q6H   [START ON 06/10/2021] lisinopril  5 mg Oral QODAY   magnesium oxide  400 mg Oral BID   pantoprazole  40 mg Oral Daily   propranolol  10 mg Oral BID  Continuous Infusions:   LOS: 1 day    Time spent: 35 mins,More than 50% of that time was spent in counseling and/or coordination of care.      Burnadette Pop, MD [facility] P9/06/2021, 11:55 AM

## 2021-06-09 NOTE — ED Notes (Signed)
CBG 155 

## 2021-06-09 NOTE — Consult Note (Signed)
Pulmonary Medicine          Date: 06/09/2021,   MRN# [id number] Alexander Cunningham [birth year]     AdmissionWeight: 79.4 kg                 CurrentWeight: 79.4 kg   Referring physician: Dr Renford Dills   CHIEF COMPLAINT:   Moderate to large pneumothorax with midline shift.    HISTORY OF PRESENT ILLNESS   70 yo M with hx of UC s/p surgery with colostomy, HTN, DM,  complaints of SOB and cough, he was seen by Dr Terance Hart and had CXR done showing pneumothorax with >50% in size and midline shift with tension physiology radiographically.  I reviewed the films with radiologist Dr Grace Isaac and we asked patient to come in for decompression. He is stable and is s/p chest tube placement now.  He had surgery team come and evaluate him with placement of pigtail catheter. We discussed natural history of pneumothorax and chest tube management. Atrium with few cc of serosang no air leak noted throughout respiratory cycle with forced expiratory maneuver.      PAST MEDICAL HISTORY   Past Medical History:  Diagnosis Date   CHF (congestive heart failure) (HCC)    Diabetes mellitus without complication (HCC)      SURGICAL HISTORY   Colostomy done 2022  FAMILY HISTORY   No family history on file.   SOCIAL HISTORY    Denies smoking or illicit drug use or alcohol abuse   MEDICATIONS    Home Medication:  Current Outpatient Rx   Order #: [id number] Class: Historical Med   Order #: [id number] Class: Historical Med   Order #: [id number] Class: Historical Med   Order #: [id number] Class: Historical Med   Order #: [id number] Class: Historical Med   Order #: [id number] Class: Historical Med   Order #: [id number] Class: Historical Med   Order #: [id number] Class: Historical Med    Current Medication:  Current Facility-Administered Medications:    acetaminophen (TYLENOL) tablet 1,000 mg, 1,000 mg, Oral, Q6H, Piscoya, Jose, MD, 1,000 mg at 06/09/21 0615   HYDROmorphone (DILAUDID) injection 0.5 mg,  0.5 mg, Intravenous, Q4H PRN, Piscoya, Jose, MD, 0.5 mg at 06/09/21 0504   insulin aspart (novoLOG) injection 0-5 Units, 0-5 Units, Subcutaneous, QHS, Adhikari, Amrit, MD   insulin aspart (novoLOG) injection 0-9 Units, 0-9 Units, Subcutaneous, TID WC, Adhikari, Amrit, MD   ketorolac (TORADOL) 30 MG/ML injection 15 mg, 15 mg, Intravenous, Q6H, Piscoya, Jose, MD, 15 mg at 06/09/21 0504   labetalol (NORMODYNE) injection 10 mg, 10 mg, Intravenous, Q2H PRN, Adhikari, Amrit, MD   oxyCODONE (Oxy IR/ROXICODONE) immediate release tablet 5-10 mg, 5-10 mg, Oral, Q4H PRN, Piscoya, Jose, MD   polyethylene glycol (MIRALAX / GLYCOLAX) packet 17 g, 17 g, Oral, Daily PRN, Burnadette Pop, MD  Current Outpatient Medications:    ferrous sulfate 325 (65 FE) MG tablet, Take 325 mg by mouth every morning., Disp: , Rfl:    gabapentin (NEURONTIN) 300 MG capsule, Take 300 mg by mouth 3 (three) times daily., Disp: , Rfl:    Insulin Glargine (BASAGLAR KWIKPEN) 100 UNIT/ML, Inject 25 Units into the skin in the morning and at bedtime. 72 units in the am and 25 units in the pm, Disp: , Rfl:    lisinopril (ZESTRIL) 5 MG tablet, Take 5 mg by mouth every other day., Disp: , Rfl:    magnesium oxide (MAG-OX) 400 MG tablet, Take 1 tablet by mouth 2 (two) times daily., Disp: , Rfl:  NOVOLOG FLEXPEN 100 UNIT/ML FlexPen, Inject 15 Units into the skin in the morning, at noon, and at bedtime., Disp: , Rfl:    omeprazole (PRILOSEC) 40 MG capsule, Take 40 mg by mouth daily., Disp: , Rfl:    propranolol (INDERAL) 10 MG tablet, Take 10 mg by mouth 2 (two) times daily., Disp: , Rfl:     ALLERGIES   Contrast media [iodinated diagnostic agents] and Tricor [fenofibrate]     REVIEW OF SYSTEMS    Review of Systems:  Gen:  Denies  fever, sweats, chills weigh loss  HEENT: Denies blurred vision, double vision, ear pain, eye pain, hearing loss, nose bleeds, sore throat Cardiac:  No dizziness, chest pain or heaviness, chest  tightness,edema Resp:   Denies cough or sputum porduction, shortness of breath,wheezing, hemoptysis,  Gi: Denies swallowing difficulty, stomach pain, nausea or vomiting, diarrhea, constipation, bowel incontinence Gu:  Denies bladder incontinence, burning urine Ext:   Denies Joint pain, stiffness or swelling Skin: Denies  skin rash, easy bruising or bleeding or hives Endoc:  Denies polyuria, polydipsia , polyphagia or weight change Psych:   Denies depression, insomnia or hallucinations   Other:  All other systems negative   VS: BP 133/67   Pulse (!) 55   Temp 97.7 F (36.5 C) (Oral)   Resp 13   Ht 5\' 10"  (1.778 m)   Wt 79.4 kg   SpO2 97%   BMI 25.11 kg/m      PHYSICAL EXAM    GENERAL:NAD, no fevers, chills, no weakness no fatigue HEAD: Normocephalic, atraumatic.  EYES: Pupils equal, round, reactive to light. Extraocular muscles intact. No scleral icterus.  MOUTH: Moist mucosal membrane. Dentition intact. No abscess noted.  EAR, NOSE, THROAT: Clear without exudates. No external lesions.  NECK: Supple. No thyromegaly. No nodules. No JVD.  PULMONARY: Mild rhonchi at right base posteriorly CARDIOVASCULAR: S1 and S2. Regular rate and rhythm. No murmurs, rubs, or gallops. No edema. Pedal pulses 2+ bilaterally.  GASTROINTESTINAL: Soft, nontender, nondistended. No masses. Positive bowel sounds. No hepatosplenomegaly.  MUSCULOSKELETAL: No swelling, clubbing, or edema. Range of motion full in all extremities.  NEUROLOGIC: Cranial nerves II through XII are intact. No gross focal neurological deficits. Sensation intact. Reflexes intact.  SKIN: No ulceration, lesions, rashes, or cyanosis. Skin warm and dry. Turgor intact.  PSYCHIATRIC: Mood, affect within normal limits. The patient is awake, alert and oriented x 3. Insight, judgment intact.       IMAGING    DG Chest 2 View  Result Date: 06/08/2021 CLINICAL DATA:  Shortness of breath EXAM: CHEST - 2 VIEW COMPARISON:  None. FINDINGS:  Moderate right pneumothorax, approximately 50% volume. The left lung is normally aerated. Mild cardiomegaly. Osseous structures are unremarkable. IMPRESSION: Moderate right pneumothorax, approximately 50% volume. No displaced rib fracture or other obvious etiology. Call report request was placed at the time of interpretation. Final communication will be documented. Electronically Signed   By: 08/08/2021 M.D.   On: 06/08/2021 15:03   DG Chest Portable 1 View  Result Date: 06/09/2021 CLINICAL DATA:  Increasing shortness of breath. EXAM: PORTABLE CHEST 1 VIEW COMPARISON:  Radiograph yesterday. FINDINGS: Unchanged positioning of right pigtail catheter coiled at the right lung base laterally. Small residual pneumothorax is now visualized at the apex under the second rib posteriorly. Slight increase in right lung base atelectasis. The exam is otherwise unchanged. Stable heart size and mediastinal contours. IMPRESSION: 1. Unchanged positioning of right pigtail catheter. Small residual right pneumothorax is now visualized at  the apex. 2. Slight increase in right lung base atelectasis. Electronically Signed   By: Narda Rutherford M.D.   On: 06/09/2021 01:47   DG Chest Portable 1 View  Result Date: 06/08/2021 CLINICAL DATA:  Chest tube EXAM: PORTABLE CHEST 1 VIEW COMPARISON:  06/08/2021 FINDINGS: Interval placement of right chest tube and re-expansion of the right lung. No visible residual pneumothorax. Bibasilar atelectasis. Heart is normal size. No acute bony abnormality. IMPRESSION: Interval placement of right chest tube with re-expansion of the right lung. No visible residual pneumothorax. Bibasilar atelectasis. Electronically Signed   By: Charlett Nose M.D.   On: 06/08/2021 22:51      ASSESSMENT/PLAN   Large spontaneous pneumothroax with tension physiology S/p surgery evaluation with chest tube placement - appreciate recommendations       Tree was not secured it is now taped down with tubing and also  provided omental tag for safety in case he turns on side while sleeping     - Chest tube management - no air leak noted     - will peripherally monitor as patient is stable now and surgical team is on board - appreciate recommendations./    Thank you for allowing me to participate in the care of this patient.  Total face to face encounter time for this patient visit was >45 min. >50% of the time was  spent in counseling and coordination of care.   Patient/Family are satisfied with care plan and all questions have been answered.  This document was prepared using Dragon voice recognition software and may include unintentional dictation errors.     Vida Rigger, M.D.  Division of Pulmonary & Critical Care Medicine  [facility]

## 2021-06-09 NOTE — ED Notes (Signed)
Pt assisted to side of bed with 2 RN's to empty colostomy bag & attempt to void. Unable to void at this time. 150cc of liquid stool from colostomy bag.  Gown changed, dressing reinforced. Call light in reach, given TV remote.

## 2021-06-09 NOTE — ED Notes (Signed)
MD at bedside to assess.

## 2021-06-09 NOTE — ED Notes (Signed)
Pt very upset that his home meds weren't restarted. This RN messaged MD, MD will restart meds per Dr. Renford Dills.

## 2021-06-09 NOTE — ED Notes (Signed)
Patients wife reports most of his medications are given by capsule. Pharmacy made aware to go ahead and dispense medication

## 2021-06-09 NOTE — Consult Note (Signed)
[facility] SURGICAL CONSULTATION NOTE (initial) - cpt: [id number]   HISTORY OF PRESENT ILLNESS (HPI):  70 y.o. male presented to [facility] ED overnight for evaluation of SOB. Patient reports around a 1 week history of progressive SOB on exertion. He typically walks 2 miles a day but this had become increasingly more difficult for him. On the morning of presentation, he woke up with severe SOB and a cough. No fever, chills, abdominal pain, nausea, emesis, urinary changes, or bowel changes. No trauma prior to the onset of symptoms. He does have a 30 ppy smoking history, but currently does not smoke, quit 15 years ago. He went to see his PCP for this but was referred to the ED for evaluation. Laboratory work up in the ED showed sCr elevation to 1.39 but was otherwise reassuring. He did undergo CXR which was concerning for right sided pneumothorax. Chest tube ws ultimately placed by EDP with good re-expansion of his lung.He was admitted to the medicine service.   Surgery is consulted by emergency medicine  physician Dr. Donna Bernard, MD in this context for chest tube management in setting of right spontaneous pneumothorax.  PAST MEDICAL HISTORY (PMH):  Past Medical History:  Diagnosis Date   CHF (congestive heart failure) (HCC)    Diabetes mellitus without complication (HCC)      PAST SURGICAL HISTORY (PSH):  Colectomy / Ileostomy  MEDICATIONS:  Prior to Admission medications   Medication Sig Start Date End Date Taking? Authorizing Provider  ferrous sulfate 325 (65 FE) MG tablet Take 325 mg by mouth every morning. 01/04/21  Yes [provider]  gabapentin (NEURONTIN) 300 MG capsule Take 300 mg by mouth 3 (three) times daily. 05/16/21  Yes [provider]  Insulin Glargine (BASAGLAR KWIKPEN) 100 UNIT/ML Inject 25 Units into the skin in the morning and at bedtime. 72 units in the am and 25 units in the pm 04/06/21  Yes [provider]  lisinopril (ZESTRIL) 5 MG tablet  Take 5 mg by mouth every other day. 04/17/21  Yes [provider]  magnesium oxide (MAG-OX) 400 MG tablet Take 1 tablet by mouth 2 (two) times daily. 05/03/21  Yes [provider]  NOVOLOG FLEXPEN 100 UNIT/ML FlexPen Inject 15 Units into the skin in the morning, at noon, and at bedtime. 04/06/21  Yes [provider]  omeprazole (PRILOSEC) 40 MG capsule Take 40 mg by mouth daily. 05/26/21  Yes [provider]  propranolol (INDERAL) 10 MG tablet Take 10 mg by mouth 2 (two) times daily. 05/14/21  Yes [provider]     ALLERGIES:  Allergies  Allergen Reactions   Contrast Media [Iodinated Diagnostic Agents] Rash   Tricor [Fenofibrate] Rash     SOCIAL HISTORY:  Social History   Socioeconomic History   Marital status: Married    Spouse name: Not on file   Number of children: Not on file   Years of education: Not on file   Highest education level: Not on file  Occupational History   Not on file  Tobacco Use   Smoking status: Not on file   Smokeless tobacco: Not on file  Substance and Sexual Activity   Alcohol use: Not on file   Drug use: Not on file   Sexual activity: Not on file  Other Topics Concern   Not on file  Social History Narrative   Not on file   Social Determinants of Health   Financial Resource Strain: Not on file  Food Insecurity:  Not on file  Transportation Needs: Not on file  Physical Activity: Not on file  Stress: Not on file  Social Connections: Not on file  Intimate Partner Violence: Not on file     FAMILY HISTORY:  No family history on file.    REVIEW OF SYSTEMS:  Review of Systems  Constitutional:  Negative for chills and fever.  HENT:  Negative for congestion and sore throat.   Respiratory:  Positive for cough and shortness of breath.   Cardiovascular:  Negative for chest pain and palpitations.  Gastrointestinal:  Negative for abdominal pain, nausea and vomiting.  Genitourinary:  Negative for dysuria and  urgency.  All other systems reviewed and are negative.  VITAL SIGNS:  Temp:  [97.7 F (36.5 C)] 97.7 F (36.5 C) (09/08 1426) Pulse Rate:  [57-77] 57 (09/09 0600) Resp:  [12-21] 12 (09/09 0600) BP: (131-188)/(64-87) 131/67 (09/09 0600) SpO2:  [96 %-100 %] 96 % (09/09 0600) Weight:  [79.4 kg] 79.4 kg (09/08 1420)     Height: 5\' 10"  (177.8 cm) Weight: 79.4 kg BMI (Calculated): 25.11   INTAKE/OUTPUT:  No intake/output data recorded.  PHYSICAL EXAM:  Physical Exam Vitals and nursing note reviewed.  Constitutional:      General: He is not in acute distress.    Appearance: He is well-developed and normal weight. He is not ill-appearing.  HENT:     Head: Normocephalic and atraumatic.  Eyes:     Extraocular Movements: Extraocular movements intact.     Pupils: Pupils are equal, round, and reactive to light.  Cardiovascular:     Rate and Rhythm: Normal rate and regular rhythm.     Heart sounds: No murmur heard. Pulmonary:     Effort: Pulmonary effort is normal.     Breath sounds: Normal breath sounds. No decreased breath sounds or wheezing.  Chest:    Abdominal:     General: The ostomy site is clean. There is no distension.     Palpations: Abdomen is soft.     Comments: Ileostomy present in RLQ  Skin:    General: Skin is warm and dry.     Coloration: Skin is not pale.     Findings: No erythema.  Neurological:     General: No focal deficit present.     Mental Status: He is alert and oriented to person, place, and time.  Psychiatric:        Mood and Affect: Mood normal.        Behavior: Behavior normal.     Labs:  CBC Latest Ref Rng & Units 06/08/2021  WBC 4.0 - 10.5 K/uL 7.4  Hemoglobin 13.0 - 17.0 g/dL 08/08/2021  Hematocrit 40.1 - 52.0 % 40.3  Platelets 150 - 400 K/uL 123(L)   CMP Latest Ref Rng & Units 06/08/2021  Glucose 70 - 99 mg/dL 08/08/2021)  BUN 8 - 23 mg/dL 21  Creatinine 253(G - 6.44 mg/dL 0.34)  Sodium 7.42(V - 956 mmol/L 138  Potassium 3.5 - 5.1 mmol/L 4.0   Chloride 98 - 111 mmol/L 105  CO2 22 - 32 mmol/L 26  Calcium 8.9 - 10.3 mg/dL 9.8     Imaging studies:   CXR (06/08/2021 - 1500) personally reviewed with right sided pneumothorax, and radiologist report reviewed below:  IMPRESSION: Moderate right pneumothorax, approximately 50% volume. No displaced rib fracture or other obvious etiology.   Call report request was placed at the time of interpretation. Final communication will be documented.   CXR (06/08/2021 - 2251) personally  reviewed showing newly placed right sided chest tube and improvement in pneumothorax, and radiologist report reviewed below:  IMPRESSION: Interval placement of right chest tube with re-expansion of the right lung. No visible residual pneumothorax.   Bibasilar atelectasis.  CXR (06/09/2021 - 0145) personally reviewed with stable chest tube, small apical right PTX, and radiologist report reviewed below:  IMPRESSION: 1. Unchanged positioning of right pigtail catheter. Small residual right pneumothorax is now visualized at the apex. 2. Slight increase in right lung base atelectasis.   Assessment/Plan: (ICD-10's: J93.9) 70 y.o. male with right sided spontaneous pneumothorax, suspect secondary to ruptured bleb   - Appreciate medicine admission - Recommend continuing chest tube to -20 cm suction today - Repeat CXR in the AM. IF lung remains inflated without air leuk, can consider going to water seal tomorrow   - Aggressive pulmonary toilet; Incentive spirometer use   - Pain control prn - Okay to ambulate; chest tube can be taken off suction to walk   - Further management per primary service; we will follow  All of the above findings and recommendations were discussed with the patient, and all of his questions were answered to his expressed satisfaction.  Thank you for the opportunity to participate in this patient's care.   -- Lynden Oxford, PA-C [facility] 06/09/2021, 7:36  AM [phone] M-F: 7am - 4pm

## 2021-06-09 NOTE — ED Notes (Signed)
Report given to oncoming RN all questions answered

## 2021-06-09 NOTE — ED Notes (Addendum)
This RN at bedside, pt noted to be calling out in pain. He states he reached for water at bedside then had 10/10 pain between shoulders & on L chest.    MD messaged immediately, pt repositioned. Noted to have audible air bubbles in line. Connections checked. No obvious disconnect.    New orders from on call surgeon noted.

## 2021-06-09 NOTE — ED Notes (Signed)
Order placed for prilosec to be given to patient. Pharmacy confirmed having medication. MD Amrit made aware and approved. Patient now relaying he cannot take capsules which is all pharmacy has per [facility]. Patient reports he is calling wife to bring his prilosec from home. Patient given hospital phone to use.

## 2021-06-10 ENCOUNTER — Inpatient Hospital Stay: Payer: Medicare Other

## 2021-06-10 ENCOUNTER — Encounter: Payer: Self-pay | Admitting: Internal Medicine

## 2021-06-10 LAB — CBC WITH DIFFERENTIAL/PLATELET
Abs Immature Granulocytes: 0.01 10*3/uL (ref 0.00–0.07)
Basophils Absolute: 0 10*3/uL (ref 0.0–0.1)
Basophils Relative: 1 %
Eosinophils Absolute: 0.2 10*3/uL (ref 0.0–0.5)
Eosinophils Relative: 4 %
HCT: 35 % — ABNORMAL LOW (ref 39.0–52.0)
Hemoglobin: 12.1 g/dL — ABNORMAL LOW (ref 13.0–17.0)
Immature Granulocytes: 0 %
Lymphocytes Relative: 32 %
Lymphs Abs: 1.9 10*3/uL (ref 0.7–4.0)
MCH: 31.2 pg (ref 26.0–34.0)
MCHC: 34.6 g/dL (ref 30.0–36.0)
MCV: 90.2 fL (ref 80.0–100.0)
Monocytes Absolute: 0.5 10*3/uL (ref 0.1–1.0)
Monocytes Relative: 8 %
Neutro Abs: 3.3 10*3/uL (ref 1.7–7.7)
Neutrophils Relative %: 55 %
Platelets: 83 10*3/uL — ABNORMAL LOW (ref 150–400)
RBC: 3.88 MIL/uL — ABNORMAL LOW (ref 4.22–5.81)
RDW: 12.7 % (ref 11.5–15.5)
WBC: 6 10*3/uL (ref 4.0–10.5)
nRBC: 0 % (ref 0.0–0.2)

## 2021-06-10 LAB — GLUCOSE, CAPILLARY
Glucose-Capillary: 194 mg/dL — ABNORMAL HIGH (ref 70–99)
Glucose-Capillary: 196 mg/dL — ABNORMAL HIGH (ref 70–99)
Glucose-Capillary: 296 mg/dL — ABNORMAL HIGH (ref 70–99)
Glucose-Capillary: 242 mg/dL — ABNORMAL HIGH (ref 70–99)

## 2021-06-10 LAB — BASIC METABOLIC PANEL
Anion gap: 6 (ref 5–15)
BUN: 39 mg/dL — ABNORMAL HIGH (ref 8–23)
CO2: 23 mmol/L (ref 22–32)
Calcium: 9.2 mg/dL (ref 8.9–10.3)
Chloride: 109 mmol/L (ref 98–111)
Creatinine, Ser: 2.17 mg/dL — ABNORMAL HIGH (ref 0.61–1.24)
GFR, Estimated: 32 mL/min — ABNORMAL LOW (ref >60)
Glucose, Bld: 213 mg/dL — ABNORMAL HIGH (ref 70–99)
Potassium: 4 mmol/L (ref 3.5–5.1)
Sodium: 138 mmol/L (ref 135–145)

## 2021-06-10 MED ORDER — SODIUM CHLORIDE 0.9 % IV SOLN: INTRAVENOUS | Status: DC

## 2021-06-10 MED ORDER — MAGNESIUM OXIDE -MG SUPPLEMENT 400 (240 MG) MG PO TABS
400.0000 mg | ORAL_TABLET | Freq: Two times a day (BID) | ORAL | Status: DC
  Administered 2021-06-11: 400 mg via ORAL
  Filled 2021-06-10: qty 1

## 2021-06-10 MED ORDER — PANTOPRAZOLE SODIUM 40 MG PO TBEC
80.0000 mg | DELAYED_RELEASE_TABLET | Freq: Every day | ORAL | Status: DC
  Administered 2021-06-10 – 2021-06-11 (×2): 80 mg via ORAL
  Filled 2021-06-10 (×2): qty 2

## 2021-06-10 NOTE — TOC CM/SW Note (Signed)
CSW completed chart review.  No TOC needs identified. Please place TOC consult if needs arise.  Linwood Gullikson, LCSW [phone]  

## 2021-06-10 NOTE — Progress Notes (Signed)
PROGRESS NOTE    Alexander Cunningham  YKD:[id number] DOB: 04-04-1951 DOA: 06/08/2021 PCP: Dorothey Baseman, MD   Chief Complain:SOB  Brief Narrative:  Alexander Cunningham is a 70 y.o. male with medical history significant of diabetes type 2, hypertension, ulcerative colitis status post colectomy/ ileostomy who presented from home with complaints of worsening shortness of breath, cough.   he woke up a day before admission with severe shortness of breath and cough.  He went to see his primary care physician.  Patient was sent to the emergency department for x-ray of the chest.  Chest x-ray done in the emergency room showed right-sided pneumothorax about 50% in size.  Chest tube was inserted on the right side in the emergency department.  General surgery following.  Assessment & Plan:   Principal Problem:   Pneumothorax on right Active Problems:   Stage 3 chronic kidney disease (HCC)   Essential hypertension   Type 2 diabetes mellitus with peripheral neuropathy (HCC)   Ulcerative colitis (HCC)   Respiratory failure with hypoxia (HCC)   Pneumothorax   Moderate right-sided pneumothorax: Presented with progressive worsening of shortness of breath on exertion, worsening cough. No history of COPD/emphysema.  He is a past smoker.  No active lung disease. Chest x-ray showed moderate pneumothorax approximately with 50% of volume. Underwent  chest tube placement.  General surgery following.  Follow-up chest x-ray today showed resolution of pneumothorax.  Currently chest tube on waterseal.  Plan for follow-up chest x-ray tomorrow Auscultation revealed very good return of air entry on the right side  Acute hypoxic respiratory failure: resolved. Required  2 L of oxygen on presentation, on room air now  Hypertension: Continue his home medications.  Monitor blood pressure   Diabetes type 2: Continue sliding scale insulin.  Takes insulin at home. Hemoglobin A1c of 7.7 as per 04/17/21.  Monitor blood  sugars   History of ulcerative colitis: History of colectomy in 2016.  Currently on colostomy.  Follows with gastroenterology.  Currently in remission.   Stage IIIa CKD:  Baseline creatinine around 1.4.  Creatinine bumped up today to 2.  Check BMP tomorrow   Thrombocytopenia: Likely chronic, upon reviewing his previous blood works.  Continue to monitor.  Stable.           DVT prophylaxis:SCD Code Status: Full Family Communication: Wife at bedside on 06/10/21 Status is: Inpatient  Remains inpatient appropriate because:Unsafe d/c plan  Dispo: The patient is from: Home              Anticipated d/c is to: Home tomorrow              Patient currently is not medically stable to d/c.   Difficult to place patient No     Consultants: General surgery, PCCM  Procedures: Chest tube placement  Antimicrobials:  Anti-infectives (From admission, onward)    None       Subjective: Patient seen and examined the bedside this morning.  Hemodynamically stable.  Comfortable.  Denies new complaints today.  No worsening cough or shortness of breath.  Objective: Vitals:   06/09/21 2327 06/10/21 0010 06/10/21 0528 06/10/21 0843  BP:  (!) 165/67 138/61 140/63  Pulse:  (!) 52 62 (!) 56  Resp:  16 16 18   Temp:  97.8 F (36.6 C) 97.6 F (36.4 C) 97.9 F (36.6 C)  TempSrc:  Oral Oral Oral  SpO2: 97% 100% 100% 98%  Weight:  80 kg    Height:  5\' 11"  (1.803 m)  Intake/Output Summary (Last 24 hours) at 06/10/2021 1133 Last data filed at 06/10/2021 1034 Gross per 24 hour  Intake 240 ml  Output 880 ml  Net -640 ml   Filed Weights   06/08/21 1420 06/10/21 0010  Weight: 79.4 kg 80 kg    Examination:  General exam: Overall comfortable, not in distress HEENT: PERRL Respiratory system:  no wheezes or crackles , right-sided chest tube Cardiovascular system: S1 & S2 heard, RRR.  Gastrointestinal system: Abdomen is nondistended, soft and nontender. Central nervous system: Alert and  oriented Extremities: No edema, no clubbing ,no cyanosis Skin: No rashes, no ulcers,no icterus     Data Reviewed: I have personally reviewed following labs and imaging studies  CBC: Recent Labs  Lab 06/08/21 1426 06/09/21 0723 06/10/21 1022  WBC 7.4 7.4 6.0  NEUTROABS  --   --  3.3  HGB 14.1 13.4 12.1*  HCT 40.3 39.2 35.0*  MCV 91.2 91.4 90.2  PLT 123* 97* 83*   Basic Metabolic Panel: Recent Labs  Lab 06/08/21 1426 06/09/21 0723 06/10/21 0455  NA 138 137 138  K 4.0 4.8 4.0  CL 105 107 109  CO2 26 25 23   GLUCOSE 254* 171* 213*  BUN 21 23 39*  CREATININE 1.39* 1.67* 2.17*  CALCIUM 9.8 9.9 9.2   GFR: Estimated Creatinine Clearance: 33.7 mL/min (A) (by C-G formula based on SCr of 2.17 mg/dL (H)). Liver Function Tests: No results for input(s): AST, ALT, ALKPHOS, BILITOT, PROT, ALBUMIN in the last 168 hours. No results for input(s): LIPASE, AMYLASE in the last 168 hours. No results for input(s): AMMONIA in the last 168 hours. Coagulation Profile: No results for input(s): INR, PROTIME in the last 168 hours. Cardiac Enzymes: No results for input(s): CKTOTAL, CKMB, CKMBINDEX, TROPONINI in the last 168 hours. BNP (last 3 results) No results for input(s): PROBNP in the last 8760 hours. HbA1C: No results for input(s): HGBA1C in the last 72 hours. CBG: Recent Labs  Lab 06/09/21 0721 06/09/21 1306 06/09/21 1745 06/09/21 2131 06/10/21 0841  GLUCAP 155* 151* 235* 278* 194*   Lipid Profile: No results for input(s): CHOL, HDL, LDLCALC, TRIG, CHOLHDL, LDLDIRECT in the last 72 hours. Thyroid Function Tests: No results for input(s): TSH, T4TOTAL, FREET4, T3FREE, THYROIDAB in the last 72 hours. Anemia Panel: No results for input(s): VITAMINB12, FOLATE, FERRITIN, TIBC, IRON, RETICCTPCT in the last 72 hours. Sepsis Labs: No results for input(s): PROCALCITON, LATICACIDVEN in the last 168 hours.  Recent Results (from the past 240 hour(s))  Resp Panel by RT-PCR (Flu A&B,  Covid) Nasopharyngeal Swab     Status: None   Collection Time: 06/08/21  5:28 PM   Specimen: Nasopharyngeal Swab; Nasopharyngeal(NP) swabs in vial transport medium  Result Value Ref Range Status   SARS Coronavirus 2 by RT PCR NEGATIVE NEGATIVE Final    Comment: (NOTE) SARS-CoV-2 target nucleic acids are NOT DETECTED.  The SARS-CoV-2 RNA is generally detectable in upper respiratory specimens during the acute phase of infection. The lowest concentration of SARS-CoV-2 viral copies this assay can detect is 138 copies/mL. A negative result does not preclude SARS-Cov-2 infection and should not be used as the sole basis for treatment or other patient management decisions. A negative result may occur with  improper specimen collection/handling, submission of specimen other than nasopharyngeal swab, presence of viral mutation(s) within the areas targeted by this assay, and inadequate number of viral copies(<138 copies/mL). A negative result must be combined with clinical observations, patient history, and epidemiological information. The  expected result is Negative.  Fact Sheet for Patients:  BloggerCourse.com  Fact Sheet for Healthcare Providers:  SeriousBroker.it  This test is no t yet approved or cleared by the [place] FDA and  has been authorized for detection and/or diagnosis of SARS-CoV-2 by FDA under an Emergency Use Authorization (EUA). This EUA will remain  in effect (meaning this test can be used) for the duration of the COVID-19 declaration under Section 564(b)(1) of the Act, 21 U.S.C.section 360bbb-3(b)(1), unless the authorization is terminated  or revoked sooner.       Influenza A by PCR NEGATIVE NEGATIVE Final   Influenza B by PCR NEGATIVE NEGATIVE Final    Comment: (NOTE) The Xpert Xpress SARS-CoV-2/FLU/RSV plus assay is intended as an aid in the diagnosis of influenza from Nasopharyngeal swab specimens and should  not be used as a sole basis for treatment. Nasal washings and aspirates are unacceptable for Xpert Xpress SARS-CoV-2/FLU/RSV testing.  Fact Sheet for Patients: BloggerCourse.com  Fact Sheet for Healthcare Providers: SeriousBroker.it  This test is not yet approved or cleared by the [place] FDA and has been authorized for detection and/or diagnosis of SARS-CoV-2 by FDA under an Emergency Use Authorization (EUA). This EUA will remain in effect (meaning this test can be used) for the duration of the COVID-19 declaration under Section 564(b)(1) of the Act, 21 U.S.C. section 360bbb-3(b)(1), unless the authorization is terminated or revoked.  Performed at [facility], [address], [place], [place] [address]          Radiology Studies: DG Chest 2 View  Result Date: 06/08/2021 CLINICAL DATA:  Shortness of breath EXAM: CHEST - 2 VIEW COMPARISON:  None. FINDINGS: Moderate right pneumothorax, approximately 50% volume. The left lung is normally aerated. Mild cardiomegaly. Osseous structures are unremarkable. IMPRESSION: Moderate right pneumothorax, approximately 50% volume. No displaced rib fracture or other obvious etiology. Call report request was placed at the time of interpretation. Final communication will be documented. Electronically Signed   By: Lauralyn Primes M.D.   On: 06/08/2021 15:03   DG Chest Port 1 View  Result Date: 06/10/2021 CLINICAL DATA:  Right pneumothorax EXAM: PORTABLE CHEST 1 VIEW COMPARISON:  06/09/2021 FINDINGS: Right basilar pigtail chest tube is unchanged. Pulmonary insufflation is normal and symmetric. Tiny right apical pneumothorax has resolved; small residual skin folds parenchymal scarring is seen at the right apex superolaterally. Chronic interstitial changes are seen at the left lung base peripherally, unchanged. No superimposed confluent pulmonary infiltrate. No pneumothorax or pleural effusion.  Cardiac size within normal limits., IMPRESSION: Stable right basilar pigtail chest tube. Resolved right apical pneumothorax. Electronically Signed   By: Helyn Numbers M.D.   On: 06/10/2021 04:10   DG Chest Portable 1 View  Result Date: 06/09/2021 CLINICAL DATA:  Increasing shortness of breath. EXAM: PORTABLE CHEST 1 VIEW COMPARISON:  Radiograph yesterday. FINDINGS: Unchanged positioning of right pigtail catheter coiled at the right lung base laterally. Small residual pneumothorax is now visualized at the apex under the second rib posteriorly. Slight increase in right lung base atelectasis. The exam is otherwise unchanged. Stable heart size and mediastinal contours. IMPRESSION: 1. Unchanged positioning of right pigtail catheter. Small residual right pneumothorax is now visualized at the apex. 2. Slight increase in right lung base atelectasis. Electronically Signed   By: Narda Rutherford M.D.   On: 06/09/2021 01:47   DG Chest Portable 1 View  Result Date: 06/08/2021 CLINICAL DATA:  Chest tube EXAM: PORTABLE CHEST 1 VIEW COMPARISON:  06/08/2021 FINDINGS: Interval  placement of right chest tube and re-expansion of the right lung. No visible residual pneumothorax. Bibasilar atelectasis. Heart is normal size. No acute bony abnormality. IMPRESSION: Interval placement of right chest tube with re-expansion of the right lung. No visible residual pneumothorax. Bibasilar atelectasis. Electronically Signed   By: Charlett Nose M.D.   On: 06/08/2021 22:51        Scheduled Meds:  acetaminophen  1,000 mg Oral Q6H   ferrous sulfate  325 mg Oral q morning   gabapentin  300 mg Oral TID   insulin aspart  0-5 Units Subcutaneous QHS   insulin aspart  0-9 Units Subcutaneous TID WC   magnesium oxide  400 mg Oral BID   pantoprazole  80 mg Oral QAC breakfast   propranolol  10 mg Oral BID   Continuous Infusions:  sodium chloride 100 mL/hr at 06/10/21 1023     LOS: 2 days    Time spent: 35 mins,More than 50% of that  time was spent in counseling and/or coordination of care.      Burnadette Pop, MD [facility] P9/07/2021, 11:33 AM

## 2021-06-10 NOTE — Progress Notes (Addendum)
Mobility Specialist - Progress Note   06/10/21 1300  Mobility  Activity Ambulated in hall  Level of Assistance Independent  Assistive Device None;Other (Comment) (IV pole)  Distance Ambulated (ft) 350 ft  Mobility Ambulated independently in hallway  Mobility Response Tolerated well  Mobility performed by Mobility specialist  $Mobility charge 1 Mobility    Pre-mobility: 62 HR During mobility: 91 HR Post-mobility: 73 HR   Pt ambulated in hallway independently. Voiced pain in R shoulder blade 5/10 upon arrival. No complaints. Denied SOB on RA. Family at bedside.   Filiberto Pinks Mobility Specialist 06/10/21, 1:17 PM

## 2021-06-10 NOTE — Plan of Care (Signed)
  Problem: Clinical Measurements: Goal: Ability to maintain clinical measurements within normal limits will improve Outcome: Progressing   Problem: Clinical Measurements: Goal: Diagnostic test results will improve Outcome: Progressing   Problem: Clinical Measurements: Goal: Respiratory complications will improve Outcome: Progressing   Problem: Nutrition: Goal: Adequate nutrition will be maintained Outcome: Completed/Met

## 2021-06-10 NOTE — Progress Notes (Addendum)
Subjective:  CC: Alexander Cunningham is a 70 y.o. male  Hospital stay day 2,   pneumothorax  HPI: No issues overnight  ROS:  General: Denies weight loss, weight gain, fatigue, fevers, chills, and night sweats. Heart: Denies chest pain, palpitations, racing heart, irregular heartbeat, leg pain or swelling, and decreased activity tolerance. Respiratory: Denies breathing difficulty, shortness of breath, wheezing, cough, and sputum. GI: Denies change in appetite, heartburn, nausea, vomiting, constipation, diarrhea, and blood in stool. GU: Denies difficulty urinating, pain with urinating, urgency, frequency, blood in urine.   Objective:   Temp:  [97.6 F (36.4 C)-97.9 F (36.6 C)] 97.9 F (36.6 C) (09/10 0843) Pulse Rate:  [52-74] 56 (09/10 0843) Resp:  [15-25] 18 (09/10 0843) BP: (138-170)/(61-86) 140/63 (09/10 0843) SpO2:  [92 %-100 %] 98 % (09/10 0843) Weight:  [80 kg] 80 kg (09/10 0010)     Height: 5\' 11"  (180.3 cm) Weight: 80 kg BMI (Calculated): 24.61   Intake/Output this shift:   Intake/Output Summary (Last 24 hours) at 06/10/2021 0948 Last data filed at 06/10/2021 0653 Gross per 24 hour  Intake --  Output 880 ml  Net -880 ml    Constitutional :  alert, cooperative, appears stated age, and no distress  Respiratory:  clear to auscultation bilaterally. Chest tube with no leak noted.  Cardiovascular:  regular rate and rhythm  Gastrointestinal: soft, non-tender; bowel sounds normal; no masses,  no organomegaly.   Skin: Cool and moist.   Psychiatric: Normal affect, non-agitated, not confused       LABS:  CMP Latest Ref Rng & Units 06/10/2021 06/09/2021 06/08/2021  Glucose 70 - 99 mg/dL 08/08/2021) 073(X) 106(Y)  BUN 8 - 23 mg/dL 694(W) 23 21  Creatinine 0.61 - 1.24 mg/dL 54(O) 2.70(J) 5.00(X)  Sodium 135 - 145 mmol/L 138 137 138  Potassium 3.5 - 5.1 mmol/L 4.0 4.8 4.0  Chloride 98 - 111 mmol/L 109 107 105  CO2 22 - 32 mmol/L 23 25 26   Calcium 8.9 - 10.3 mg/dL 9.2 9.9 9.8   CBC  Latest Ref Rng & Units 06/09/2021 06/08/2021  WBC 4.0 - 10.5 K/uL 7.4 7.4  Hemoglobin 13.0 - 17.0 g/dL 08/09/2021 08/08/2021  Hematocrit 29.9 - 52.0 % 39.2 40.3  Platelets 150 - 400 K/uL 97(L) 123(L)    RADS: CLINICAL DATA:  Right pneumothorax   EXAM: PORTABLE CHEST 1 VIEW   COMPARISON:  06/09/2021   FINDINGS: Right basilar pigtail chest tube is unchanged. Pulmonary insufflation is normal and symmetric. Tiny right apical pneumothorax has resolved; small residual skin folds parenchymal scarring is seen at the right apex superolaterally. Chronic interstitial changes are seen at the left lung base peripherally, unchanged. No superimposed confluent pulmonary infiltrate. No pneumothorax or pleural effusion. Cardiac size within normal limits.,   IMPRESSION: Stable right basilar pigtail chest tube. Resolved right apical pneumothorax.     Electronically Signed   By: 69.6 M.D.   On: 06/10/2021 04:10   Assessment:   Pnuemothorax.  Resolving.  Images reviewed personally and agree with above report. Will place tube on water seal for today.  Repeat CXR in am, hopefully d/c tube if no evidence of recurrence.  Increasing Cr this am.  Agree with continuing IVF.    Thrombocytopenia- Ok to start anticoag for DVT prophylaxis as well from surgery standpoint, if plt count improves today above 150.  HTN, DM- per hospitalist.

## 2021-06-11 ENCOUNTER — Inpatient Hospital Stay: Payer: Medicare Other

## 2021-06-11 LAB — GLUCOSE, CAPILLARY
Glucose-Capillary: 163 mg/dL — ABNORMAL HIGH (ref 70–99)
Glucose-Capillary: 283 mg/dL — ABNORMAL HIGH (ref 70–99)

## 2021-06-11 LAB — BASIC METABOLIC PANEL WITH GFR
Anion gap: 6 (ref 5–15)
BUN: 31 mg/dL — ABNORMAL HIGH (ref 8–23)
CO2: 22 mmol/L (ref 22–32)
Calcium: 8.8 mg/dL — ABNORMAL LOW (ref 8.9–10.3)
Chloride: 110 mmol/L (ref 98–111)
Creatinine, Ser: 1.58 mg/dL — ABNORMAL HIGH (ref 0.61–1.24)
GFR, Estimated: 47 mL/min — ABNORMAL LOW (ref >60)
Glucose, Bld: 305 mg/dL — ABNORMAL HIGH (ref 70–99)
Potassium: 4.7 mmol/L (ref 3.5–5.1)
Sodium: 138 mmol/L (ref 135–145)

## 2021-06-11 LAB — CBC WITH DIFFERENTIAL/PLATELET
Abs Immature Granulocytes: 0.01 10*3/uL (ref 0.00–0.07)
Basophils Absolute: 0 10*3/uL (ref 0.0–0.1)
Basophils Relative: 1 %
Eosinophils Absolute: 0.2 10*3/uL (ref 0.0–0.5)
Eosinophils Relative: 6 %
HCT: 34.4 % — ABNORMAL LOW (ref 39.0–52.0)
Hemoglobin: 11.6 g/dL — ABNORMAL LOW (ref 13.0–17.0)
Immature Granulocytes: 0 %
Lymphocytes Relative: 39 %
Lymphs Abs: 1.5 10*3/uL (ref 0.7–4.0)
MCH: 30.3 pg (ref 26.0–34.0)
MCHC: 33.7 g/dL (ref 30.0–36.0)
MCV: 89.8 fL (ref 80.0–100.0)
Monocytes Absolute: 0.3 10*3/uL (ref 0.1–1.0)
Monocytes Relative: 7 %
Neutro Abs: 1.9 10*3/uL (ref 1.7–7.7)
Neutrophils Relative %: 47 %
Platelets: 72 10*3/uL — ABNORMAL LOW (ref 150–400)
RBC: 3.83 MIL/uL — ABNORMAL LOW (ref 4.22–5.81)
RDW: 12.5 % (ref 11.5–15.5)
WBC: 4 10*3/uL (ref 4.0–10.5)
nRBC: 0 % (ref 0.0–0.2)

## 2021-06-11 MED ORDER — OXYCODONE HCL 5 MG PO TABS: 5.0000 mg | ORAL_TABLET | Freq: Four times a day (QID) | ORAL | 0 refills | Status: DC | PRN

## 2021-06-11 NOTE — Progress Notes (Signed)
Subjective:  CC: Alexander Cunningham is a 70 y.o. male  Hospital stay day 3,   pneumothorax  HPI: No issues overnight.  ROS:  General: Denies weight loss, weight gain, fatigue, fevers, chills, and night sweats. Heart: Denies chest pain, palpitations, racing heart, irregular heartbeat, leg pain or swelling, and decreased activity tolerance. Respiratory: Denies breathing difficulty, shortness of breath, wheezing, cough, and sputum. GI: Denies change in appetite, heartburn, nausea, vomiting, constipation, diarrhea, and blood in stool. GU: Denies difficulty urinating, pain with urinating, urgency, frequency, blood in urine.   Objective:   Temp:  [97.8 F (36.6 C)-98.2 F (36.8 C)] 98 F (36.7 C) (09/11 0753) Pulse Rate:  [57-60] 58 (09/11 0753) Resp:  [16-18] 18 (09/11 0753) BP: (133-149)/(64-70) 139/70 (09/11 0753) SpO2:  [98 %-100 %] 100 % (09/11 0753)     Height: 5\' 11"  (180.3 cm) Weight: 80 kg BMI (Calculated): 24.61   Intake/Output this shift:   Intake/Output Summary (Last 24 hours) at 06/11/2021 1056 Last data filed at 06/10/2021 2011 Gross per 24 hour  Intake 1248.87 ml  Output 105 ml  Net 1143.87 ml    Constitutional :  alert, cooperative, appears stated age, and no distress  Respiratory:  clear to auscultation bilaterally. Chest tube with no leak noted.  Cardiovascular:  regular rate and rhythm  Gastrointestinal: soft, non-tender; bowel sounds normal; no masses,  no organomegaly.   Skin: Cool and moist.   Psychiatric: Normal affect, non-agitated, not confused       LABS:  CMP Latest Ref Rng & Units 06/10/2021 06/09/2021 06/08/2021  Glucose 70 - 99 mg/dL 08/08/2021) 440(H) 474(Q)  BUN 8 - 23 mg/dL 595(G) 23 21  Creatinine 0.61 - 1.24 mg/dL 38(V) 5.64(P) 3.29(J)  Sodium 135 - 145 mmol/L 138 137 138  Potassium 3.5 - 5.1 mmol/L 4.0 4.8 4.0  Chloride 98 - 111 mmol/L 109 107 105  CO2 22 - 32 mmol/L 23 25 26   Calcium 8.9 - 10.3 mg/dL 9.2 9.9 9.8   CBC Latest Ref Rng & Units  06/11/2021 06/10/2021 06/09/2021  WBC 4.0 - 10.5 K/uL 4.0 6.0 7.4  Hemoglobin 13.0 - 17.0 g/dL 11.6(L) 12.1(L) 13.4  Hematocrit 39.0 - 52.0 % 34.4(L) 35.0(L) 39.2  Platelets 150 - 400 K/uL 72(L) 83(L) 97(L)    RADS: CLINICAL DATA:  Right-sided pneumothorax with chest tube.   EXAM: CHEST  1 VIEW   COMPARISON:  Yesterday   FINDINGS: Right-sided pigtail chest tube again identified inferiorly. Midline trachea. Mild cardiomegaly. No pleural effusion or pneumothorax. Nonspecific interstitial thickening again identified. Bibasilar subsegmental atelectasis.   IMPRESSION: Right-sided pigtail chest tube in place, without pneumothorax.     Electronically Signed   By: 08/10/2021 M.D.   On: 06/11/2021 09:24     Assessment:   Pnuemothorax.  Resolving.  Images reviewed personally and agree with above report. Chest tube removed at bedside with no issues.  Recheck CXR in few hours and if no recurrence, ok to be d/c'd from surgery standpoint.  Increasing Cr- pending repeat labs this am.  Further care per hospitalist.  Thrombocytopenia- chornic.  HTN, DM- per hospitalist.

## 2021-06-11 NOTE — Plan of Care (Signed)
  Problem: Clinical Measurements: Goal: Ability to maintain clinical measurements within normal limits will improve Outcome: Completed/Met   Problem: Clinical Measurements: Goal: Diagnostic test results will improve Outcome: Completed/Met   Problem: Clinical Measurements: Goal: Respiratory complications will improve Outcome: Completed/Met   Problem: Education: Goal: Knowledge of General Education information will improve Description: Including pain rating scale, medication(s)/side effects and non-pharmacologic comfort measures Outcome: Completed/Met

## 2021-06-11 NOTE — Discharge Summary (Signed)
Physician Discharge Summary  Alexander Cunningham ZHY:[id number] DOB: 10-12-1950 DOA: 06/08/2021  PCP: Dorothey BasemanBronstein, David, MD  Admit date: 06/08/2021 Discharge date: 06/11/2021  Admitted From: Home Disposition:  Home  Discharge Condition:Stable CODE STATUS:FULL Diet recommendation: Heart Healthy    Brief/Interim Summary:  Alexander FoundStanley Cunningham is a 70 y.o. male with medical history significant of diabetes type 2, hypertension, ulcerative colitis status post colectomy/ ileostomy who presented from home with complaints of worsening shortness of breath, cough.   he woke up a day before admission with severe shortness of breath and cough.  He went to see his primary care physician.  Patient was sent to the emergency department for x-ray of the chest.  Chest x-ray done in the emergency room showed right-sided pneumothorax about 50% in size.  Chest tube was inserted on the right side in the emergency department.  General surgery were following.  Respiratory status has significantly improved.  Currently on room air.  Follow-up chest x-ray showed complete resolution of pneumothorax.  Patient is stable for discharge home.  Following problems were addressed during his hospitalization:   Moderate right-sided pneumothorax: Presented with progressive worsening of shortness of breath on exertion, worsening cough. No history of COPD/emphysema.  He is a past smoker.  No active lung disease. Chest x-ray showed moderate pneumothorax approximately with 50% of volume. Underwent  chest tube placement.  General surgery were following.  Chest tube removed before dc.Follow-up chest x-ray today showed resolution of pneumothorax.   Acute hypoxic respiratory failure: resolved. Required  2 L of oxygen on presentation, on room air now   Hypertension: Continue his home medications.  Monitor blood pressure   Diabetes type 2:   Takes insulin at home. Hemoglobin A1c of 7.7 as per 04/17/21.    History of ulcerative colitis: History  of colectomy in 2016.  Currently on colostomy.  Follows with gastroenterology.  Currently in remission.   Stage IIIa CKD:  Baseline creatinine around 1.4.  Kidney function around baseline on DC day   Thrombocytopenia: Likely chronic, upon reviewing his previous blood works.  Continue to monitor as an outpatient.  Stable.    Discharge Diagnoses:  Principal Problem:   Pneumothorax on right Active Problems:   Stage 3 chronic kidney disease (HCC)   Essential hypertension   Type 2 diabetes mellitus with peripheral neuropathy (HCC)   Ulcerative colitis (HCC)   Respiratory failure with hypoxia (HCC)   Pneumothorax    Discharge Instructions  Discharge Instructions     Diet - low sodium heart healthy   Complete by: As directed    Discharge instructions   Complete by: As directed    1)Please follow up with your PCP in a week   Increase activity slowly   Complete by: As directed       Allergies as of 06/11/2021       Reactions   Contrast Media [iodinated Diagnostic Agents] Rash   Tricor [fenofibrate] Rash        Medication List     TAKE these medications    Basaglar KwikPen 100 UNIT/ML Inject 40-50 Units into the skin See admin instructions. Inject 50u under the skin every morning and inject 40u under the skin every night   ferrous sulfate 325 (65 FE) MG tablet Take 325 mg by mouth every morning.   gabapentin 300 MG capsule Commonly known as: NEURONTIN Take 300 mg by mouth 3 (three) times daily.   insulin aspart 100 UNIT/ML FlexPen Commonly known as: NOVOLOG Inject 15 Units into the skin  in the morning, at noon, and at bedtime.   lisinopril 5 MG tablet Commonly known as: ZESTRIL Take 5 mg by mouth every other day.   magnesium oxide 400 MG tablet Commonly known as: MAG-OX Take 1 tablet by mouth 2 (two) times daily.   omeprazole 40 MG capsule Commonly known as: PRILOSEC Take 40 mg by mouth daily.   oxyCODONE 5 MG immediate release tablet Commonly known  as: Oxy IR/ROXICODONE Take 1 tablet (5 mg total) by mouth every 6 (six) hours as needed for moderate pain or severe pain.   propranolol 10 MG tablet Commonly known as: INDERAL Take 10 mg by mouth 2 (two) times daily.        Follow-up Information     Dorothey Baseman, MD. Schedule an appointment as soon as possible for a visit in 1 week(s).   Specialty: Family Medicine Contact information: [address] [place] [address] [phone]                Allergies  Allergen Reactions   Contrast Media [Iodinated Diagnostic Agents] Rash   Tricor [Fenofibrate] Rash    Consultations: Surgery,PCCM   Procedures/Studies: DG Chest 1 View  Result Date: 06/11/2021 CLINICAL DATA:  Follow-up on right pneumothorax. EXAM: CHEST  1 VIEW COMPARISON:  June 11, 2021 7 a.m. FINDINGS: The heart size and mediastinal contours are stable. The heart size is enlarged. Previously noted right chest tube has been removed. There is no pleural line to suggest pneumothorax. Mild atelectasis of right lung base is noted. The left lung is clear. The visualized skeletal structures are stable. IMPRESSION: Previously noted right chest tube has been removed. There is no pleural line to suggest pneumothorax. Electronically Signed   By: Sherian Rein M.D.   On: 06/11/2021 13:33   DG Chest 1 View  Result Date: 06/11/2021 CLINICAL DATA:  Right-sided pneumothorax with chest tube. EXAM: CHEST  1 VIEW COMPARISON:  Yesterday FINDINGS: Right-sided pigtail chest tube again identified inferiorly. Midline trachea. Mild cardiomegaly. No pleural effusion or pneumothorax. Nonspecific interstitial thickening again identified. Bibasilar subsegmental atelectasis. IMPRESSION: Right-sided pigtail chest tube in place, without pneumothorax. Electronically Signed   By: Jeronimo Greaves M.D.   On: 06/11/2021 09:24   DG Chest 2 View  Result Date: 06/08/2021 CLINICAL DATA:  Shortness of breath EXAM: CHEST - 2 VIEW COMPARISON:  None.  FINDINGS: Moderate right pneumothorax, approximately 50% volume. The left lung is normally aerated. Mild cardiomegaly. Osseous structures are unremarkable. IMPRESSION: Moderate right pneumothorax, approximately 50% volume. No displaced rib fracture or other obvious etiology. Call report request was placed at the time of interpretation. Final communication will be documented. Electronically Signed   By: Lauralyn Primes M.D.   On: 06/08/2021 15:03   DG Chest Port 1 View  Result Date: 06/10/2021 CLINICAL DATA:  Right pneumothorax EXAM: PORTABLE CHEST 1 VIEW COMPARISON:  06/09/2021 FINDINGS: Right basilar pigtail chest tube is unchanged. Pulmonary insufflation is normal and symmetric. Tiny right apical pneumothorax has resolved; small residual skin folds parenchymal scarring is seen at the right apex superolaterally. Chronic interstitial changes are seen at the left lung base peripherally, unchanged. No superimposed confluent pulmonary infiltrate. No pneumothorax or pleural effusion. Cardiac size within normal limits., IMPRESSION: Stable right basilar pigtail chest tube. Resolved right apical pneumothorax. Electronically Signed   By: Helyn Numbers M.D.   On: 06/10/2021 04:10   DG Chest Portable 1 View  Result Date: 06/09/2021 CLINICAL DATA:  Increasing shortness of breath. EXAM: PORTABLE CHEST 1 VIEW  COMPARISON:  Radiograph yesterday. FINDINGS: Unchanged positioning of right pigtail catheter coiled at the right lung base laterally. Small residual pneumothorax is now visualized at the apex under the second rib posteriorly. Slight increase in right lung base atelectasis. The exam is otherwise unchanged. Stable heart size and mediastinal contours. IMPRESSION: 1. Unchanged positioning of right pigtail catheter. Small residual right pneumothorax is now visualized at the apex. 2. Slight increase in right lung base atelectasis. Electronically Signed   By: Narda Rutherford M.D.   On: 06/09/2021 01:47   DG Chest Portable  1 View  Result Date: 06/08/2021 CLINICAL DATA:  Chest tube EXAM: PORTABLE CHEST 1 VIEW COMPARISON:  06/08/2021 FINDINGS: Interval placement of right chest tube and re-expansion of the right lung. No visible residual pneumothorax. Bibasilar atelectasis. Heart is normal size. No acute bony abnormality. IMPRESSION: Interval placement of right chest tube with re-expansion of the right lung. No visible residual pneumothorax. Bibasilar atelectasis. Electronically Signed   By: Charlett Nose M.D.   On: 06/08/2021 22:51      Subjective: Patient seen and examined at the bedside this morning.  Hemodynamically stable for discharge today.  Discharge Exam: Vitals:   06/11/21 0438 06/11/21 0753  BP: 133/67 139/70  Pulse: 60 (!) 58  Resp: 18 18  Temp: 98 F (36.7 C) 98 F (36.7 C)  SpO2: 98% 100%   Vitals:   06/10/21 1615 06/10/21 2023 06/11/21 0438 06/11/21 0753  BP: (!) 149/66 (!) 142/64 133/67 139/70  Pulse: 60 (!) 57 60 (!) 58  Resp: Temp: 97.8 F (36.6 C) 98.2 F (36.8 C) 98 F (36.7 C) 98 F (36.7 C)  TempSrc: Oral Oral Oral   SpO2: 99%  98% 100%  Weight:      Height:        General: Pt is alert, awake, not in acute distress Cardiovascular: RRR, S1/S2 +, no rubs, no gallops Respiratory: CTA bilaterally, no wheezing, no rhonchi Abdominal: Soft, NT, ND, bowel sounds + Extremities: no edema, no cyanosis    The results of significant diagnostics from this hospitalization (including imaging, microbiology, ancillary and laboratory) are listed below for reference.     Microbiology: Recent Results (from the past 240 hour(s))  Resp Panel by RT-PCR (Flu A&B, Covid) Nasopharyngeal Swab     Status: None   Collection Time: 06/08/21  5:28 PM   Specimen: Nasopharyngeal Swab; Nasopharyngeal(NP) swabs in vial transport medium  Result Value Ref Range Status   SARS Coronavirus 2 by RT PCR NEGATIVE NEGATIVE Final    Comment: (NOTE) SARS-CoV-2 target nucleic acids are NOT  DETECTED.  The SARS-CoV-2 RNA is generally detectable in upper respiratory specimens during the acute phase of infection. The lowest concentration of SARS-CoV-2 viral copies this assay can detect is 138 copies/mL. A negative result does not preclude SARS-Cov-2 infection and should not be used as the sole basis for treatment or other patient management decisions. A negative result may occur with  improper specimen collection/handling, submission of specimen other than nasopharyngeal swab, presence of viral mutation(s) within the areas targeted by this assay, and inadequate number of viral copies(<138 copies/mL). A negative result must be combined with clinical observations, patient history, and epidemiological information. The expected result is Negative.  Fact Sheet for Patients:  BloggerCourse.com  Fact Sheet for Healthcare Providers:  SeriousBroker.it  This test is no t yet approved or cleared by the [place] FDA and  has been authorized for detection and/or diagnosis of SARS-CoV-2 by FDA  under an Emergency Use Authorization (EUA). This EUA will remain  in effect (meaning this test can be used) for the duration of the COVID-19 declaration under Section 564(b)(1) of the Act, 21 U.S.C.section 360bbb-3(b)(1), unless the authorization is terminated  or revoked sooner.       Influenza A by PCR NEGATIVE NEGATIVE Final   Influenza B by PCR NEGATIVE NEGATIVE Final    Comment: (NOTE) The Xpert Xpress SARS-CoV-2/FLU/RSV plus assay is intended as an aid in the diagnosis of influenza from Nasopharyngeal swab specimens and should not be used as a sole basis for treatment. Nasal washings and aspirates are unacceptable for Xpert Xpress SARS-CoV-2/FLU/RSV testing.  Fact Sheet for Patients: BloggerCourse.com  Fact Sheet for Healthcare Providers: SeriousBroker.it  This test is not yet  approved or cleared by the [place] FDA and has been authorized for detection and/or diagnosis of SARS-CoV-2 by FDA under an Emergency Use Authorization (EUA). This EUA will remain in effect (meaning this test can be used) for the duration of the COVID-19 declaration under Section 564(b)(1) of the Act, 21 U.S.C. section 360bbb-3(b)(1), unless the authorization is terminated or revoked.  Performed at [facility], [address] [place], [place] [address]      Labs: BNP (last 3 results) No results for input(s): BNP in the last 8760 hours. Basic Metabolic Panel: Recent Labs  Lab 06/08/21 1426 06/09/21 0723 06/10/21 0455 06/11/21 1004  NA 138 137 138 138  K 4.0 4.8 4.0 4.7  CL 105 107 109 110  CO2 26 25 23 22   GLUCOSE 254* 171* 213* 305*  BUN 21 23 39* 31*  CREATININE 1.39* 1.67* 2.17* 1.58*  CALCIUM 9.8 9.9 9.2 8.8*   Liver Function Tests: No results for input(s): AST, ALT, ALKPHOS, BILITOT, PROT, ALBUMIN in the last 168 hours. No results for input(s): LIPASE, AMYLASE in the last 168 hours. No results for input(s): AMMONIA in the last 168 hours. CBC: Recent Labs  Lab 06/08/21 1426 06/09/21 0723 06/10/21 1022 06/11/21 0500  WBC 7.4 7.4 6.0 4.0  NEUTROABS  --   --  3.3 1.9  HGB 14.1 13.4 12.1* 11.6*  HCT 40.3 39.2 35.0* 34.4*  MCV 91.2 91.4 90.2 89.8  PLT 123* 97* 83* 72*   Cardiac Enzymes: No results for input(s): CKTOTAL, CKMB, CKMBINDEX, TROPONINI in the last 168 hours. BNP: Invalid input(s): POCBNP CBG: Recent Labs  Lab 06/10/21 1152 06/10/21 1653 06/10/21 2158 06/11/21 0759 06/11/21 1152  GLUCAP 196* 296* 242* 163* 283*   D-Dimer No results for input(s): DDIMER in the last 72 hours. Hgb A1c No results for input(s): HGBA1C in the last 72 hours. Lipid Profile No results for input(s): CHOL, HDL, LDLCALC, TRIG, CHOLHDL, LDLDIRECT in the last 72 hours. Thyroid function studies No results for input(s): TSH, T4TOTAL, T3FREE, THYROIDAB  in the last 72 hours.  Invalid input(s): FREET3 Anemia work up No results for input(s): VITAMINB12, FOLATE, FERRITIN, TIBC, IRON, RETICCTPCT in the last 72 hours. Urinalysis No results Cunningham for: COLORURINE, APPEARANCEUR, LABSPEC, PHURINE, GLUCOSEU, HGBUR, BILIRUBINUR, KETONESUR, PROTEINUR, UROBILINOGEN, NITRITE, LEUKOCYTESUR Sepsis Labs Invalid input(s): PROCALCITONIN,  WBC,  LACTICIDVEN Microbiology Recent Results (from the past 240 hour(s))  Resp Panel by RT-PCR (Flu A&B, Covid) Nasopharyngeal Swab     Status: None   Collection Time: 06/08/21  5:28 PM   Specimen: Nasopharyngeal Swab; Nasopharyngeal(NP) swabs in vial transport medium  Result Value Ref Range Status   SARS Coronavirus 2 by RT PCR NEGATIVE NEGATIVE Final    Comment: (NOTE)  SARS-CoV-2 target nucleic acids are NOT DETECTED.  The SARS-CoV-2 RNA is generally detectable in upper respiratory specimens during the acute phase of infection. The lowest concentration of SARS-CoV-2 viral copies this assay can detect is 138 copies/mL. A negative result does not preclude SARS-Cov-2 infection and should not be used as the sole basis for treatment or other patient management decisions. A negative result may occur with  improper specimen collection/handling, submission of specimen other than nasopharyngeal swab, presence of viral mutation(s) within the areas targeted by this assay, and inadequate number of viral copies(<138 copies/mL). A negative result must be combined with clinical observations, patient history, and epidemiological information. The expected result is Negative.  Fact Sheet for Patients:  BloggerCourse.com  Fact Sheet for Healthcare Providers:  SeriousBroker.it  This test is no t yet approved or cleared by the [place] FDA and  has been authorized for detection and/or diagnosis of SARS-CoV-2 by FDA under an Emergency Use Authorization (EUA). This EUA will  remain  in effect (meaning this test can be used) for the duration of the COVID-19 declaration under Section 564(b)(1) of the Act, 21 U.S.C.section 360bbb-3(b)(1), unless the authorization is terminated  or revoked sooner.       Influenza A by PCR NEGATIVE NEGATIVE Final   Influenza B by PCR NEGATIVE NEGATIVE Final    Comment: (NOTE) The Xpert Xpress SARS-CoV-2/FLU/RSV plus assay is intended as an aid in the diagnosis of influenza from Nasopharyngeal swab specimens and should not be used as a sole basis for treatment. Nasal washings and aspirates are unacceptable for Xpert Xpress SARS-CoV-2/FLU/RSV testing.  Fact Sheet for Patients: BloggerCourse.com  Fact Sheet for Healthcare Providers: SeriousBroker.it  This test is not yet approved or cleared by the [place] FDA and has been authorized for detection and/or diagnosis of SARS-CoV-2 by FDA under an Emergency Use Authorization (EUA). This EUA will remain in effect (meaning this test can be used) for the duration of the COVID-19 declaration under Section 564(b)(1) of the Act, 21 U.S.C. section 360bbb-3(b)(1), unless the authorization is terminated or revoked.  Performed at [facility], [address], [place], [place] [address]     Please note: You were cared for by a hospitalist during your hospital stay. Once you are discharged, your primary care physician will handle any further medical issues. Please note that NO REFILLS for any discharge medications will be authorized once you are discharged, as it is imperative that you return to your primary care physician (or establish a relationship with a primary care physician if you do not have one) for your post hospital discharge needs so that they can reassess your need for medications and monitor your lab values.    Time coordinating discharge: 40 minutes  SIGNED:   Burnadette Pop, MD  [facility] 06/11/2021, 1:36 PM Pager [phone]  If 7PM-7AM, please contact night-coverage [email or web address] Password [email or web address]

## 2021-06-11 NOTE — Progress Notes (Signed)
Pt discharged home with spouse in stable condition. Discharge instructions given. Pt verbalized understanding. No immediate questions or concerns at this time. Pt chose to ambulate off the unit.

## 2021-06-26 ENCOUNTER — Other Ambulatory Visit: Payer: Self-pay | Admitting: Gastroenterology

## 2021-06-26 DIAGNOSIS — K7460 Unspecified cirrhosis of liver: Secondary | ICD-10-CM

## 2021-07-05 ENCOUNTER — Other Ambulatory Visit: Payer: Self-pay

## 2021-07-05 ENCOUNTER — Ambulatory Visit
Admission: RE | Admit: 2021-07-05 | Discharge: 2021-07-05 | Disposition: A | Discharge status: Home / Self Care | Payer: Medicare Other | Source: Ambulatory Visit | Attending: Gastroenterology | Admitting: Gastroenterology

## 2021-07-05 DIAGNOSIS — K7460 Unspecified cirrhosis of liver: Secondary | ICD-10-CM | POA: Insufficient documentation

## 2021-08-16 ENCOUNTER — Encounter: Payer: Self-pay | Admitting: Gastroenterology

## 2021-08-17 ENCOUNTER — Encounter
Admission: RE | Disposition: A | Discharge status: Home / Self Care | Payer: Self-pay | Source: Home / Self Care | Attending: Gastroenterology

## 2021-08-17 ENCOUNTER — Encounter: Payer: Self-pay | Admitting: Gastroenterology

## 2021-08-17 ENCOUNTER — Ambulatory Visit: Payer: Medicare Other | Admitting: Anesthesiology

## 2021-08-17 ENCOUNTER — Ambulatory Visit
Admission: RE | Admit: 2021-08-17 | Discharge: 2021-08-17 | Disposition: A | Discharge status: Home / Self Care | Payer: Medicare Other | Attending: Gastroenterology | Admitting: Gastroenterology

## 2021-08-17 DIAGNOSIS — K2970 Gastritis, unspecified, without bleeding: Secondary | ICD-10-CM | POA: Insufficient documentation

## 2021-08-17 DIAGNOSIS — E1122 Type 2 diabetes mellitus with diabetic chronic kidney disease: Secondary | ICD-10-CM | POA: Diagnosis not present

## 2021-08-17 DIAGNOSIS — I8510 Secondary esophageal varices without bleeding: Secondary | ICD-10-CM | POA: Diagnosis not present

## 2021-08-17 DIAGNOSIS — K5190 Ulcerative colitis, unspecified, without complications: Secondary | ICD-10-CM | POA: Insufficient documentation

## 2021-08-17 DIAGNOSIS — Z794 Long term (current) use of insulin: Secondary | ICD-10-CM | POA: Diagnosis not present

## 2021-08-17 DIAGNOSIS — Z79899 Other long term (current) drug therapy: Secondary | ICD-10-CM | POA: Insufficient documentation

## 2021-08-17 DIAGNOSIS — N189 Chronic kidney disease, unspecified: Secondary | ICD-10-CM | POA: Insufficient documentation

## 2021-08-17 DIAGNOSIS — K2289 Other specified disease of esophagus: Secondary | ICD-10-CM | POA: Diagnosis not present

## 2021-08-17 DIAGNOSIS — K7460 Unspecified cirrhosis of liver: Secondary | ICD-10-CM | POA: Insufficient documentation

## 2021-08-17 DIAGNOSIS — K317 Polyp of stomach and duodenum: Secondary | ICD-10-CM | POA: Insufficient documentation

## 2021-08-17 DIAGNOSIS — I509 Heart failure, unspecified: Secondary | ICD-10-CM | POA: Diagnosis not present

## 2021-08-17 DIAGNOSIS — K219 Gastro-esophageal reflux disease without esophagitis: Secondary | ICD-10-CM | POA: Insufficient documentation

## 2021-08-17 DIAGNOSIS — K766 Portal hypertension: Secondary | ICD-10-CM | POA: Insufficient documentation

## 2021-08-17 DIAGNOSIS — I130 Hypertensive heart and chronic kidney disease with heart failure and stage 1 through stage 4 chronic kidney disease, or unspecified chronic kidney disease: Secondary | ICD-10-CM | POA: Diagnosis not present

## 2021-08-17 HISTORY — DX: Gastro-esophageal reflux disease without esophagitis: K21.9

## 2021-08-17 HISTORY — PX: ESOPHAGOGASTRODUODENOSCOPY: SHX5428

## 2021-08-17 HISTORY — DX: Ulcerative colitis, unspecified, without complications: K51.90

## 2021-08-17 LAB — GLUCOSE, CAPILLARY: Glucose-Capillary: 155 mg/dL — ABNORMAL HIGH (ref 70–99)

## 2021-08-17 SURGERY — EGD (ESOPHAGOGASTRODUODENOSCOPY)
Anesthesia: General

## 2021-08-17 MED ORDER — LIDOCAINE HCL (PF) 2 % IJ SOLN
INTRAMUSCULAR | Status: AC
  Filled 2021-08-17: qty 5

## 2021-08-17 MED ORDER — SODIUM CHLORIDE 0.9 % IV SOLN: INTRAVENOUS | Status: DC

## 2021-08-17 MED ORDER — PROPOFOL 500 MG/50ML IV EMUL
INTRAVENOUS | Status: DC | PRN
  Administered 2021-08-17: 150 ug/kg/min via INTRAVENOUS

## 2021-08-17 MED ORDER — LIDOCAINE HCL (CARDIAC) PF 100 MG/5ML IV SOSY
PREFILLED_SYRINGE | INTRAVENOUS | Status: DC | PRN
  Administered 2021-08-17: 80 mg via INTRAVENOUS

## 2021-08-17 MED ORDER — PROPOFOL 500 MG/50ML IV EMUL
INTRAVENOUS | Status: AC
  Filled 2021-08-17: qty 50

## 2021-08-17 NOTE — Op Note (Signed)
[facility] Gastroenterology Patient Name: Alexander Cunningham Procedure Date: 08/17/2021 9:47 AM MRN: [id number] Account #: [id number] Date of Birth: Jun 09, 1951 Admit Type: Outpatient Age: 70 Room: [facility] ENDO ROOM 1 Gender: Male Note Status: Finalized Instrument Name: Upper Endoscope [id number] Procedure:             Upper GI endoscopy Indications:           Cirrhosis rule out esophageal varices Providers:             Rueben Bash, DO Referring MD:          Youlanda Roys. Lovie Macadamia, MD (Referring MD) Medicines:             Monitored Anesthesia Care Complications:         No immediate complications. Estimated blood loss:                         Minimal. Procedure:             Pre-Anesthesia Assessment:                        - Prior to the procedure, a History and Physical was                         performed, and patient medications and allergies were                         reviewed. The patient is competent. The risks and                         benefits of the procedure and the sedation options and                         risks were discussed with the patient. All questions                         were answered and informed consent was obtained.                         Patient identification and proposed procedure were                         verified by the physician, the nurse, the anesthetist                         and the technician in the endoscopy suite. Mental                         Status Examination: alert and oriented. Airway                         Examination: normal oropharyngeal airway and neck                         mobility. Respiratory Examination: clear to                         auscultation. CV Examination: RRR, no murmurs, no S3  or S4. Prophylactic Antibiotics: The patient does not                         require prophylactic antibiotics. Prior                         Anticoagulants: The patient has taken no  previous                         anticoagulant or antiplatelet agents. ASA Grade                         Assessment: III - A patient with severe systemic                         disease. After reviewing the risks and benefits, the                         patient was deemed in satisfactory condition to                         undergo the procedure. The anesthesia plan was to use                         monitored anesthesia care (MAC). Immediately prior to                         administration of medications, the patient was                         re-assessed for adequacy to receive sedatives. The                         heart rate, respiratory rate, oxygen saturations,                         blood pressure, adequacy of pulmonary ventilation, and                         response to care were monitored throughout the                         procedure. The physical status of the patient was                         re-assessed after the procedure.                        After obtaining informed consent, the endoscope was                         passed under direct vision. Throughout the procedure,                         the patient's blood pressure, pulse, and oxygen                         saturations were monitored continuously. The Endoscope  was introduced through the mouth, and advanced to the                         second part of duodenum. The upper GI endoscopy was                         accomplished without difficulty. The patient tolerated                         the procedure well. Findings:      The second portion of the duodenum was normal. Estimated blood loss:       none.      Multiple less than 5 mm sessile polyps with no bleeding and no stigmata       of recent bleeding were found on the greater curvature of the stomach       and in the gastric antrum. Biopsies were taken with a cold forceps for       histology for representative view. Estimated  blood loss was minimal.      Mild portal hypertensive gastropathy was found in the entire examined       stomach. Biopsies were taken with a cold forceps for Helicobacter pylori       testing. Estimated blood loss was minimal. No gastric varices observed.      The Z-line was irregular and was found 42 cm from the incisors.       Estimated blood loss was minimal. C0M2 salmon colored mucosa. Biopsied       with cold forceps to rule out barretts esophagus      Esophagogastric landmarks were identified: the gastroesophageal junction       was found at 42 cm from the incisors.      Grade I varix (one) were found in the distal esophagus. They were small       in size. Estimated blood loss: none.      The exam was otherwise without abnormality. Impression:            - Normal second portion of the duodenum.                        - Multiple gastric polyps. Biopsied.                        - Portal hypertensive gastropathy. Biopsied.                        - Z-line irregular, 42 cm from the incisors.                        - Esophagogastric landmarks identified.                        - Grade I esophageal varices.                        - The examination was otherwise normal. Recommendation:        - Discharge patient to home.                        - Resume previous diet.                        -  Continue present medications.                        - Await pathology results.                        - Repeat upper endoscopy in 2 years for surveillance.                        - Return to GI office as previously scheduled. Procedure Code(s):     --- Professional ---                        [phone], Esophagogastroduodenoscopy, flexible,                         transoral; with biopsy, single or multiple Diagnosis Code(s):     --- Professional ---                        K74.60, Unspecified cirrhosis of liver                        I85.10, Secondary esophageal varices without bleeding                         K31.7, Polyp of stomach and duodenum                        K76.6, Portal hypertension                        K31.89, Other diseases of stomach and duodenum                        K22.8, Other specified diseases of esophagus CPT copyright 2019 American [employer] Association. All rights reserved. The codes documented in this report are preliminary and upon coder review may  be revised to meet current compliance requirements. Attending Participation:      I personally performed the entire procedure. Volney American, DO Annamaria Helling DO, DO 08/17/2021 10:23:12 AM This report has been signed electronically. Number of Addenda: 0 Note Initiated On: 08/17/2021 9:47 AM Estimated Blood Loss:  Estimated blood loss was minimal.      [facility]

## 2021-08-17 NOTE — Anesthesia Procedure Notes (Signed)
Date/Time: 08/17/2021 10:07 AM Performed by: Tonia Ghent Pre-anesthesia Checklist: Patient identified, Emergency Drugs available, Suction available, Patient being monitored and Timeout performed Patient Re-evaluated:Patient Re-evaluated prior to induction Oxygen Delivery Method: Nasal cannula Preoxygenation: Pre-oxygenation with 100% oxygen Induction Type: IV induction Airway Equipment and Method: Bite block Placement Confirmation: CO2 detector and positive ETCO2

## 2021-08-17 NOTE — Interval H&P Note (Signed)
History and Physical Interval Note: Preprocedure H&P from 08/17/21  was reviewed and there was no interval change after seeing and examining the patient.  Written consent was obtained from the patient after discussion of risks, benefits, and alternatives. Patient has consented to proceed with Esophagogastroduodenoscopy with possible intervention   08/17/2021 10:00 AM  [facility]  has presented today for surgery, with the diagnosis of Cirrhosis of liver without ascites, unspecified hepatic cirrhosis type (CMS-HCC) (K74.60).  The various methods of treatment have been discussed with the patient and family. After consideration of risks, benefits and other options for treatment, the patient has consented to  Procedure(s) with comments: ESOPHAGOGASTRODUODENOSCOPY (EGD) (N/A) - IDDM as a surgical intervention.  The patient's history has been reviewed, patient examined, no change in status, stable for surgery.  I have reviewed the patient's chart and labs.  Questions were answered to the patient's satisfaction.     Alexander Cunningham

## 2021-08-17 NOTE — Anesthesia Preprocedure Evaluation (Signed)
Anesthesia Evaluation  Patient identified by MRN, date of birth, ID band Patient awake    Reviewed: Allergy & Precautions, H&P , NPO status , Patient's Chart, lab work & pertinent test results, reviewed documented beta blocker date and time   Airway Mallampati: II  TM Distance: >3 FB Neck ROM: Full    Dental  (+) Lower Dentures, Partial Upper   Pulmonary neg pulmonary ROS, former smoker,    Pulmonary exam normal        Cardiovascular hypertension, Pt. on medications and Pt. on home beta blockers +CHF  Normal cardiovascular exam     Neuro/Psych  Neuromuscular disease negative psych ROS   GI/Hepatic PUD, GERD  Medicated,(+) Cirrhosis   Esophageal Varices    ,   Endo/Other  negative endocrine ROSdiabetes, Insulin Dependent  Renal/GU Renal disease  negative genitourinary   Musculoskeletal negative musculoskeletal ROS (+)   Abdominal   Peds negative pediatric ROS (+)  Hematology negative hematology ROS (+)   Anesthesia Other Findings . Chronic kidney disease  . Diabetes mellitus without complication (CMS-HCC)  . GERD (gastroesophageal reflux disease)  . Ulcerative colitis (CMS-HCC)     Reproductive/Obstetrics negative OB ROS                             Anesthesia Physical Anesthesia Plan  ASA: 3  Anesthesia Plan: General   Post-op Pain Management:    Induction: Intravenous  PONV Risk Score and Plan: 2 and Propofol infusion and TIVA  Airway Management Planned: Natural Airway and Nasal Cannula  Additional Equipment:   Intra-op Plan:   Post-operative Plan:   Informed Consent: I have reviewed the patients History and Physical, chart, labs and discussed the procedure including the risks, benefits and alternatives for the proposed anesthesia with the patient or authorized representative who has indicated his/her understanding and acceptance.       Plan Discussed with: CRNA,  Anesthesiologist and Surgeon  Anesthesia Plan Comments:         Anesthesia Quick Evaluation

## 2021-08-17 NOTE — H&P (Signed)
Pre-Procedure H&P   Patient ID: Alexander Cunningham is a 70 y.o. male.  Gastroenterology Provider: [name], DO  Referring Provider: Tawni Pummel, PA PCP: Dorothey Baseman, MD  Date: 08/17/2021  HPI Alexander Cunningham is a 70 y.o. male who presents today for Esophagogastroduodenoscopy for  gerd, h/o gib, pill dysphagia.  Only dysphagia to one particular pill. No other dysphagia or odynphagia.  No melena/hematochezia.  Reflux controlled on ppi. Creatinine 1.2  Personal h/o uc in 1987 s/p colectomy in 2016. Hgb 12.3 mcv 94. Plt 109. Inr 1.2.   Past Medical History:  Diagnosis Date   CHF (congestive heart failure) (HCC)    Diabetes mellitus without complication (HCC)    GERD (gastroesophageal reflux disease)    Ulcerative colitis (HCC)     Past Surgical History:  Procedure Laterality Date   COLON SURGERY     HERNIA REPAIR      Family History No h/o GI disease or malignancy  Review of Systems  Constitutional:  Negative for activity change, appetite change, chills, fatigue, fever and unexpected weight change.  HENT:  Positive for trouble swallowing. Negative for voice change.   Respiratory:  Negative for shortness of breath.   Cardiovascular:  Negative for chest pain and palpitations.  Gastrointestinal:  Negative for abdominal distention, abdominal pain, anal bleeding, blood in stool, constipation, diarrhea, nausea and vomiting.       Gerd  Musculoskeletal:  Negative for arthralgias and myalgias.  Skin:  Negative for color change and pallor.  Neurological:  Negative for dizziness, syncope and weakness.  Psychiatric/Behavioral:  Negative for confusion. The patient is not nervous/anxious.   All other systems reviewed and are negative.   Medications No current facility-administered medications on file prior to encounter.   Current Outpatient Medications on File Prior to Encounter  Medication Sig Dispense Refill   ferrous sulfate 325 (65 FE) MG tablet  Take 325 mg by mouth every morning.     gabapentin (NEURONTIN) 300 MG capsule Take 300 mg by mouth 3 (three) times daily.     insulin aspart (NOVOLOG) 100 UNIT/ML FlexPen Inject 15 Units into the skin in the morning, at noon, and at bedtime.     Insulin Glargine (BASAGLAR KWIKPEN) 100 UNIT/ML Inject 40-50 Units into the skin See admin instructions. Inject 50u under the skin every morning and inject 40u under the skin every night     lisinopril (ZESTRIL) 5 MG tablet Take 5 mg by mouth every other day.     magnesium oxide (MAG-OX) 400 MG tablet Take 1 tablet by mouth 2 (two) times daily.     omeprazole (PRILOSEC) 40 MG capsule Take 40 mg by mouth daily.     oxyCODONE (OXY IR/ROXICODONE) 5 MG immediate release tablet Take 1 tablet (5 mg total) by mouth every 6 (six) hours as needed for moderate pain or severe pain. (Patient not taking: Reported on 08/17/2021) 15 tablet 0   propranolol (INDERAL) 10 MG tablet Take 10 mg by mouth 2 (two) times daily.      Pertinent medications related to GI and procedure were reviewed by me with the patient prior to the procedure   Current Facility-Administered Medications:    0.9 %  sodium chloride infusion, , Intravenous, Continuous, [name], DO, Last Rate: 20 mL/hr at 08/17/21 3875, Continued from Pre-op at 08/17/21 6433  sodium chloride 20 mL/hr at 08/17/21 2951       Allergies  Allergen Reactions   Contrast Media [Iodinated Diagnostic Agents] Rash  Tricor [Fenofibrate] Rash   Allergies were reviewed by me prior to the procedure  Objective    Vitals:   08/17/21 0744  BP: 129/72  Pulse: 63  Resp: 17  Temp: (!) 97.2 F (36.2 C)  TempSrc: Temporal  SpO2: 100%  Weight: 81.6 kg  Height: 5\' 10"  (1.778 m)     Physical Exam Vitals and nursing note reviewed.  Constitutional:      General: He is not in acute distress.    Appearance: Normal appearance. He is not ill-appearing, toxic-appearing or diaphoretic.  HENT:     Head:  Normocephalic and atraumatic.     Nose: Nose normal.     Mouth/Throat:     Mouth: Mucous membranes are moist.     Pharynx: Oropharynx is clear.  Eyes:     General: No scleral icterus.    Extraocular Movements: Extraocular movements intact.  Cardiovascular:     Rate and Rhythm: Normal rate and regular rhythm.     Heart sounds: Normal heart sounds. No murmur heard.   No friction rub. No gallop.  Pulmonary:     Effort: Pulmonary effort is normal. No respiratory distress.     Breath sounds: Normal breath sounds. No wheezing, rhonchi or rales.  Abdominal:     General: Bowel sounds are normal. There is no distension.     Palpations: Abdomen is soft.     Tenderness: There is no abdominal tenderness. There is no guarding or rebound.     Comments: Ileostomy RLQ  Musculoskeletal:     Cervical back: Neck supple.     Right lower leg: No edema.     Left lower leg: No edema.  Skin:    General: Skin is warm and dry.     Coloration: Skin is not jaundiced or pale.     Comments: Telangiectasias on chest  Neurological:     General: No focal deficit present.     Mental Status: He is alert and oriented to person, place, and time. Mental status is at baseline.  Psychiatric:        Mood and Affect: Mood normal.        Behavior: Behavior normal.        Thought Content: Thought content normal.        Judgment: Judgment normal.     Assessment:  Alexander Cunningham is a 70 y.o. male  who presents today for Esophagogastroduodenoscopy for gerd, h/o gib, pill dysphagia.  Plan:  Esophagogastroduodenoscopy with possible intervention today  Esophagogastroduodenoscopy with possible biopsy, control of bleeding, polypectomy, and interventions as necessary has been discussed with the patient/patient representative. Informed consent was obtained from the patient/patient representative after explaining the indication, nature, and risks of the procedure including but not limited to death, bleeding,  perforation, missed neoplasm/lesions, cardiorespiratory compromise, and reaction to medications. Opportunity for questions was given and appropriate answers were provided. Patient/patient representative has verbalized understanding is amenable to undergoing the procedure.   65, DO  [facility]  Portions of the record may have been created with voice recognition software. Occasional wrong-word or 'sound-a-like' substitutions may have occurred due to the inherent limitations of voice recognition software.  Read the chart carefully and recognize, using context, where substitutions may have occurred.

## 2021-08-17 NOTE — Transfer of Care (Signed)
Immediate Anesthesia Transfer of Care Note  Patient: Alexander Cunningham  Procedure(s) Performed: ESOPHAGOGASTRODUODENOSCOPY (EGD)  Patient Location: PACU  Anesthesia Type:General  Level of Consciousness: awake and sedated  Airway & Oxygen Therapy: Patient Spontanous Breathing and Patient connected to nasal cannula oxygen  Post-op Assessment: Report given to RN and Post -op Vital signs reviewed and stable  Post vital signs: Reviewed and stable  Last Vitals:  Vitals Value Taken Time  BP    Temp    Pulse    Resp    SpO2      Last Pain:  Vitals:   08/17/21 0744  TempSrc: Temporal  PainSc: 0-No pain         Complications: No notable events documented.

## 2021-08-17 NOTE — Anesthesia Postprocedure Evaluation (Signed)
Anesthesia Post Note  Patient: Alexander Cunningham  Procedure(s) Performed: ESOPHAGOGASTRODUODENOSCOPY (EGD)  Patient location during evaluation: Phase II Anesthesia Type: General Level of consciousness: awake and alert, awake and oriented Pain management: pain level controlled Vital Signs Assessment: post-procedure vital signs reviewed and stable Respiratory status: spontaneous breathing, nonlabored ventilation and respiratory function stable Cardiovascular status: blood pressure returned to baseline and stable Postop Assessment: no apparent nausea or vomiting Anesthetic complications: no   No notable events documented.   Last Vitals:  Vitals:   08/17/21 0744 08/17/21 1020  BP: 129/72 107/64  Pulse: 63   Resp: 17   Temp: (!) 36.2 C (!) 36.1 C  SpO2: 100%     Last Pain:  Vitals:   08/17/21 1050  TempSrc:   PainSc: 0-No pain                 Manfred Arch

## 2021-08-18 ENCOUNTER — Encounter: Payer: Self-pay | Admitting: Gastroenterology

## 2021-08-18 LAB — SURGICAL PATHOLOGY

## 2022-02-18 IMAGING — US US ABDOMEN COMPLETE
1 series · 14 of 25 positions shown · non-contrast
Comparison: None.

CLINICAL DATA: Cirrhosis

EXAM:
ABDOMEN ULTRASOUND COMPLETE

[Series 1: us abdomen complete · 14 of 59 slices shown]
[im 1/59]
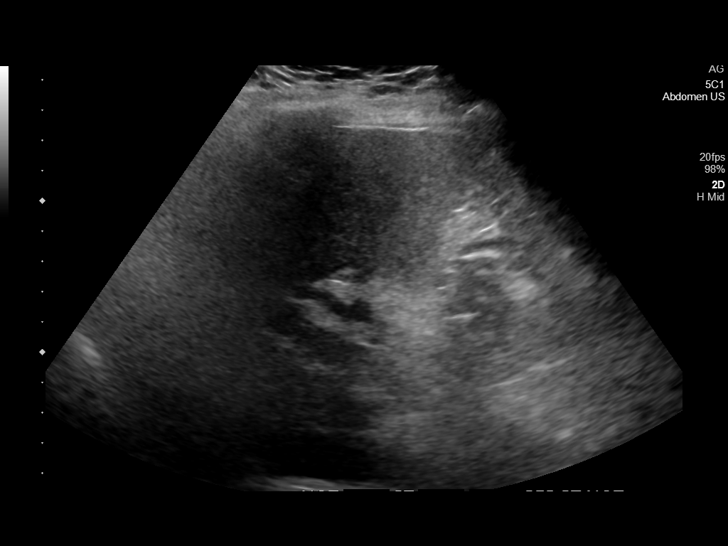
[im 5/59]
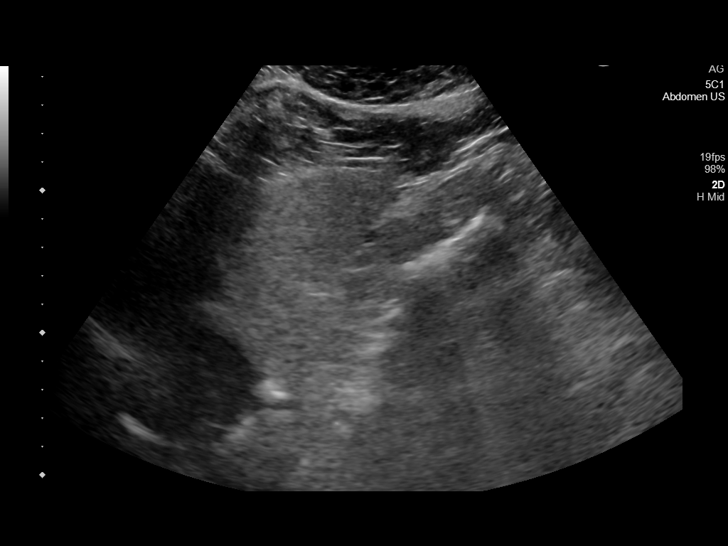
[im 10/59]
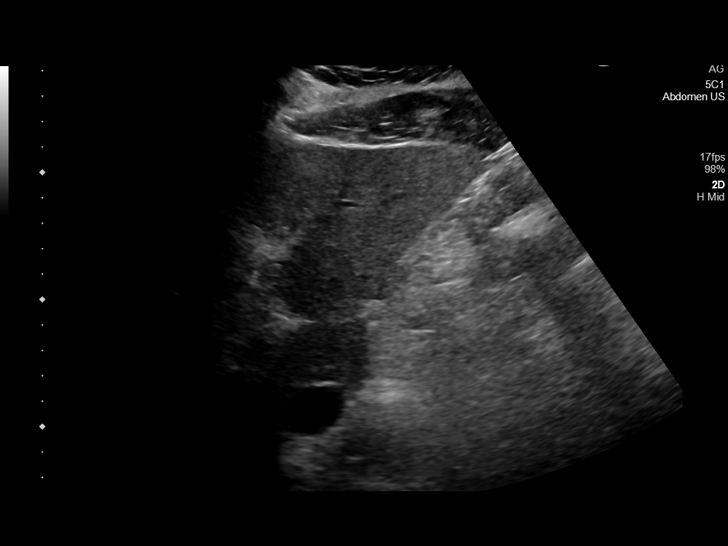
[im 15/59]
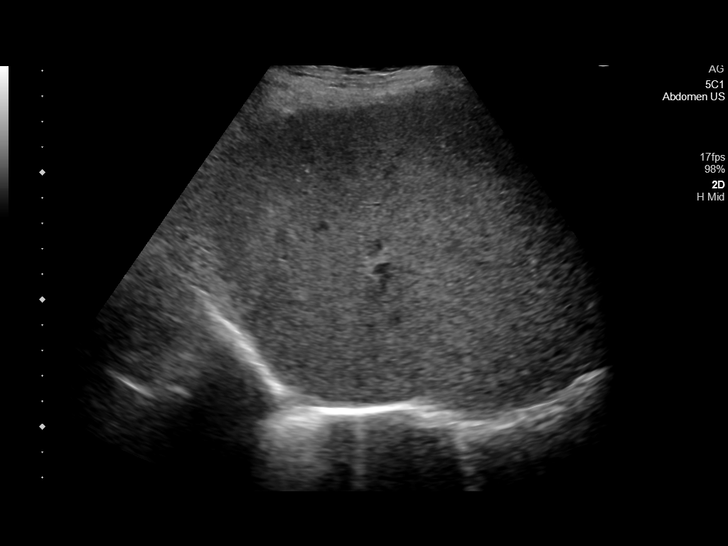
[im 20/59]
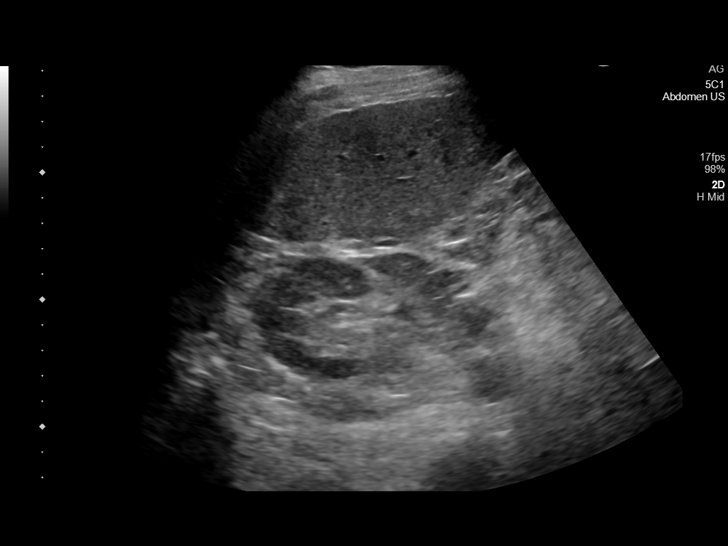
[im 22/59]
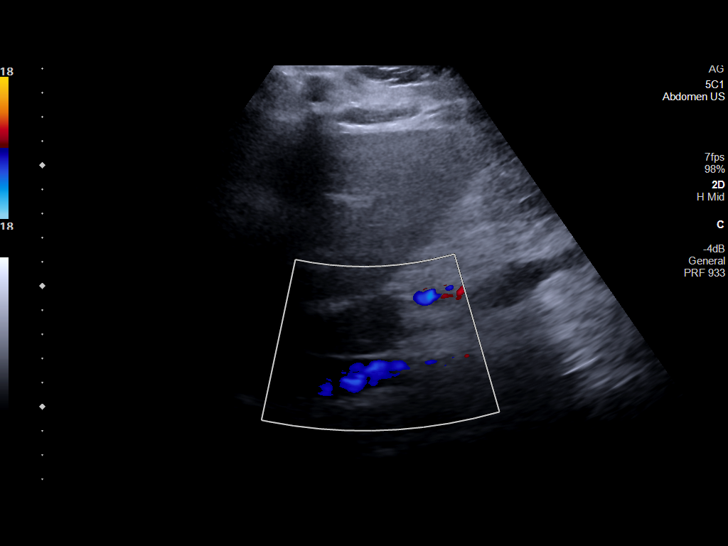
[im 27/59]
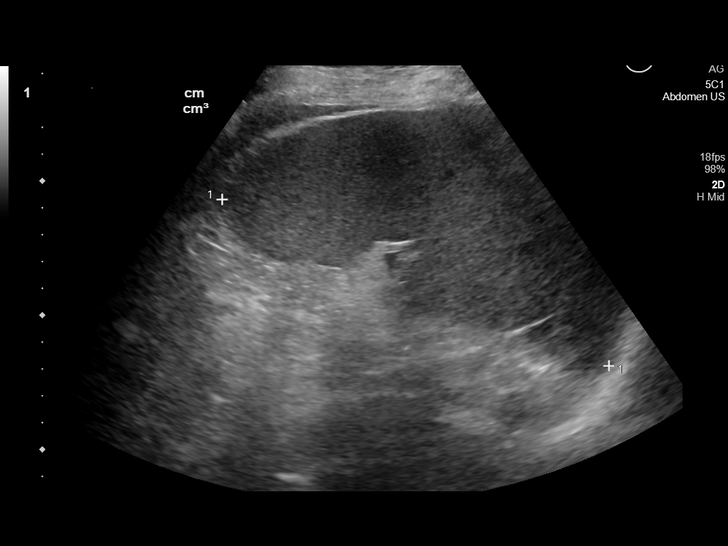
[im 32/59]
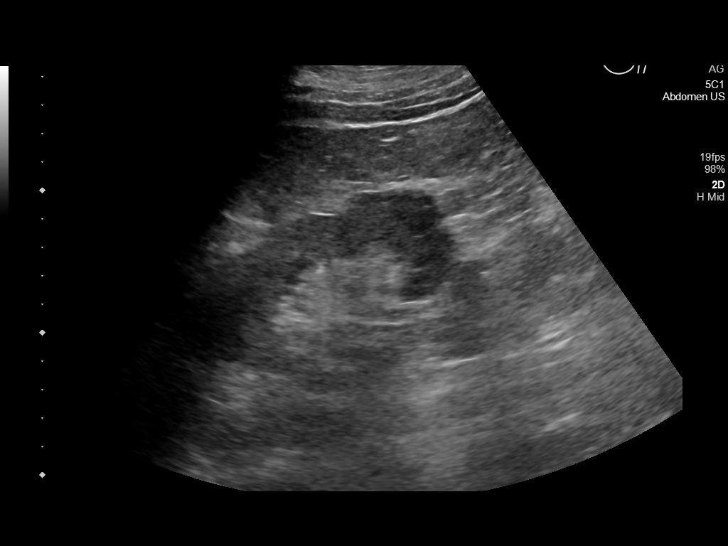
[im 37/59]
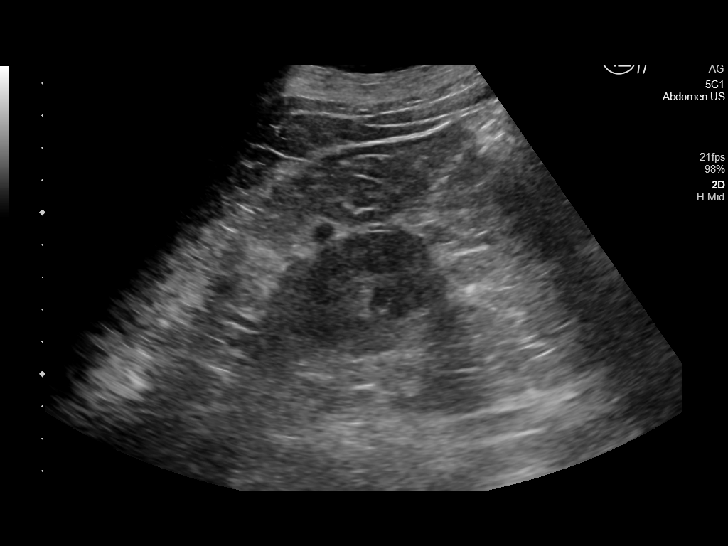
[im 39/59]
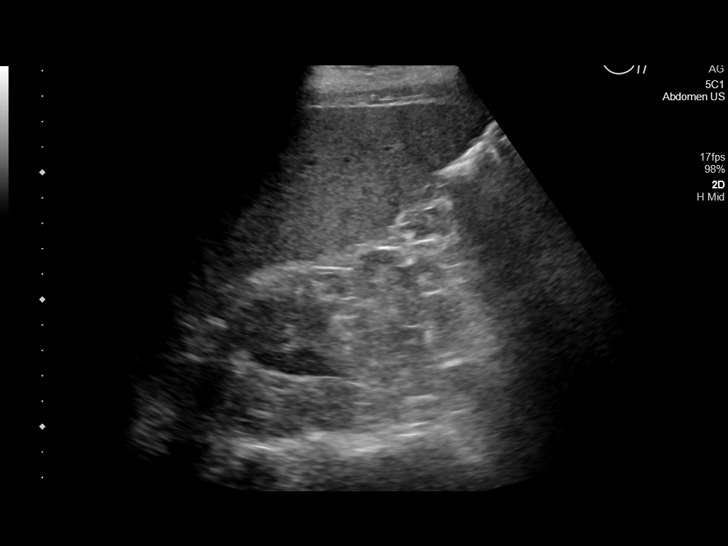
[im 44/59]
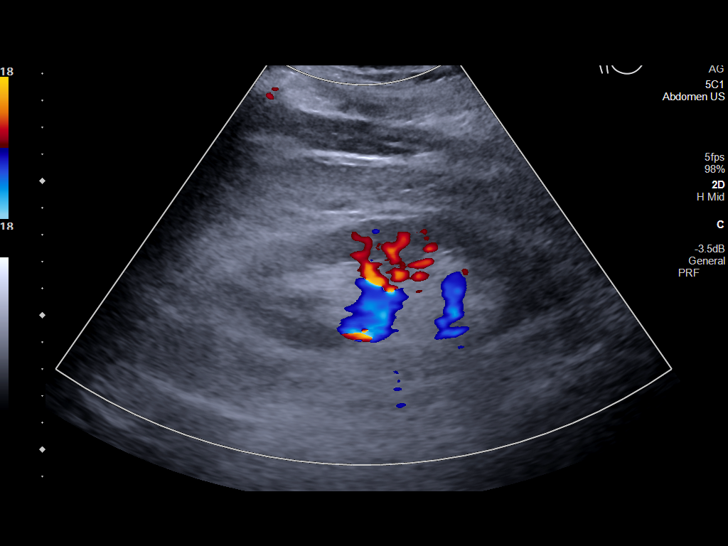
[im 49/59]
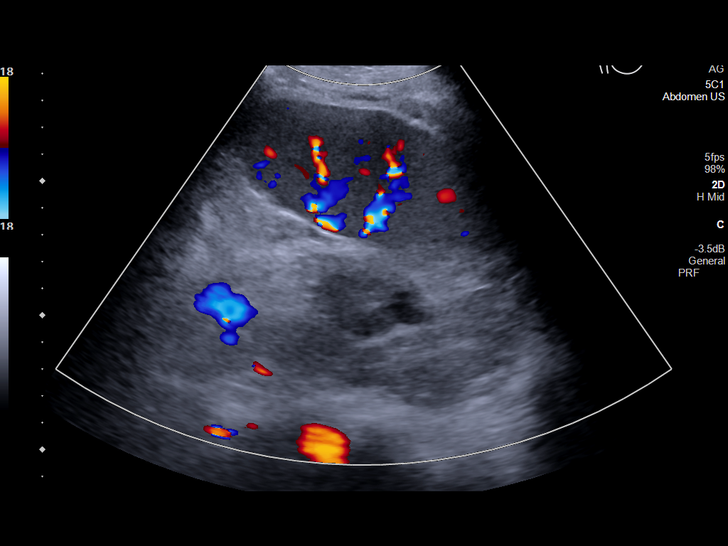
[im 54/59]
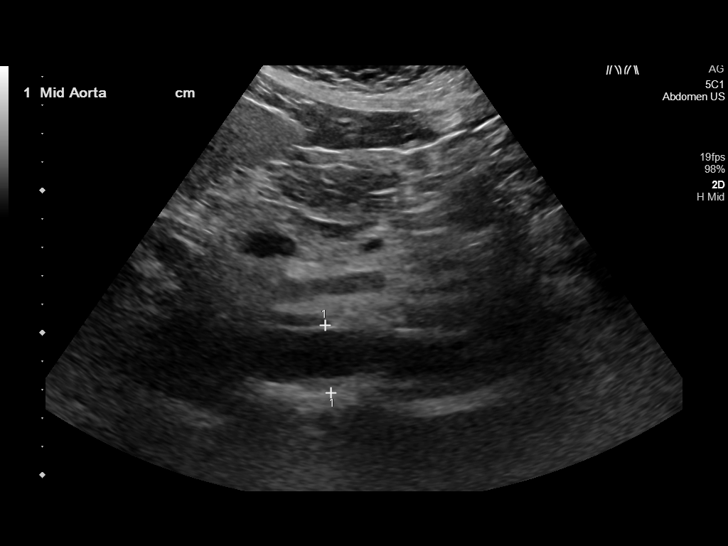
[im 59/59]
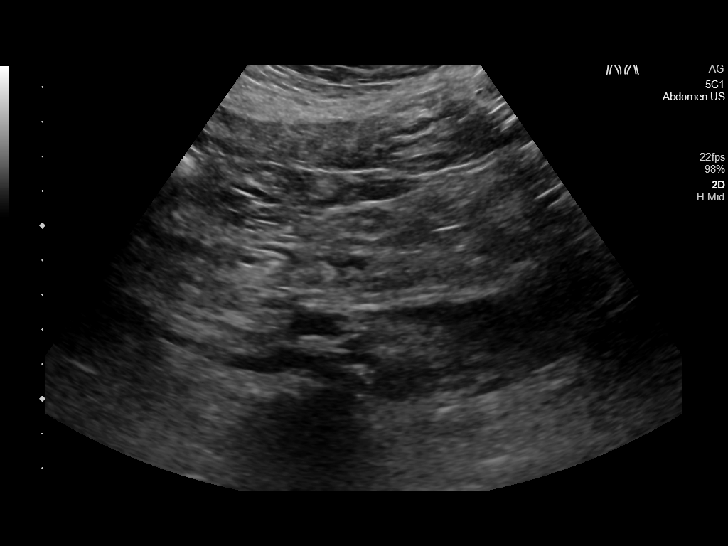

[14 of 25 positions shown; findings below may reference images not displayed]

FINDINGS: Gallbladder: Nonvisualized and may be surgically absent

Common bile duct: Diameter: 7 mm

Liver: Liver appears slightly coarsened echotexture. No focal
hepatic abnormality. Portal vein is patent on color Doppler imaging
with normal direction of blood flow towards the liver.

IVC: No abnormality visualized.

Pancreas: Visualized portion unremarkable.

Spleen: Enlarged with estimated volume of 1115.1 cc

Right Kidney: Length: 10.1 cm. Echogenicity normal. No
hydronephrosis. Tiny cyst lower pole measuring 6 mm

Left Kidney: Length: 11.8 cm. Echogenicity within normal limits. No
mass or hydronephrosis.

Abdominal aorta: No aneurysm visualized.

Other findings: None.
IMPRESSION: 1. Slightly coarse liver echotexture without focal hepatic mass
lesion. Suggestion of subtle contour nodularity presumably
corresponding to history of cirrhosis.
2. Splenomegaly
3. Nonvisualized gallbladder which is presumed surgically absent

## 2022-03-01 ENCOUNTER — Other Ambulatory Visit: Payer: Self-pay

## 2022-03-01 ENCOUNTER — Emergency Department
Admission: EM | Admit: 2022-03-01 | Discharge: 2022-03-01 | Disposition: A | Discharge status: Home / Self Care | Payer: Medicare Other | Attending: Emergency Medicine | Admitting: Emergency Medicine

## 2022-03-01 ENCOUNTER — Emergency Department: Payer: Medicare Other

## 2022-03-01 DIAGNOSIS — R1031 Right lower quadrant pain: Secondary | ICD-10-CM | POA: Insufficient documentation

## 2022-03-01 DIAGNOSIS — E119 Type 2 diabetes mellitus without complications: Secondary | ICD-10-CM | POA: Diagnosis not present

## 2022-03-01 DIAGNOSIS — I509 Heart failure, unspecified: Secondary | ICD-10-CM | POA: Diagnosis not present

## 2022-03-01 LAB — COMPREHENSIVE METABOLIC PANEL
ALT: 26 U/L (ref 0–44)
AST: 33 U/L (ref 15–41)
Albumin: 3.6 g/dL (ref 3.5–5.0)
Alkaline Phosphatase: 69 U/L (ref 38–126)
Anion gap: 5 (ref 5–15)
BUN: 23 mg/dL (ref 8–23)
CO2: 19 mmol/L — ABNORMAL LOW (ref 22–32)
Calcium: 9.9 mg/dL (ref 8.9–10.3)
Chloride: 111 mmol/L (ref 98–111)
Creatinine, Ser: 1.55 mg/dL — ABNORMAL HIGH (ref 0.61–1.24)
GFR, Estimated: 48 mL/min — ABNORMAL LOW (ref >60)
Glucose, Bld: 154 mg/dL — ABNORMAL HIGH (ref 70–99)
Potassium: 4.6 mmol/L (ref 3.5–5.1)
Sodium: 135 mmol/L (ref 135–145)
Total Bilirubin: 1 mg/dL (ref 0.3–1.2)
Total Protein: 7.2 g/dL (ref 6.5–8.1)

## 2022-03-01 LAB — URINALYSIS, ROUTINE W REFLEX MICROSCOPIC
Bilirubin Urine: NEGATIVE
Glucose, UA: NEGATIVE mg/dL
Hgb urine dipstick: NEGATIVE
Ketones, ur: NEGATIVE mg/dL
Leukocytes,Ua: NEGATIVE
Nitrite: NEGATIVE
Protein, ur: NEGATIVE mg/dL
Specific Gravity, Urine: 1.012 (ref 1.005–1.030)
pH: 5 (ref 5.0–8.0)

## 2022-03-01 LAB — CBC
HCT: 38.9 % — ABNORMAL LOW (ref 39.0–52.0)
Hemoglobin: 12.6 g/dL — ABNORMAL LOW (ref 13.0–17.0)
MCH: 29.7 pg (ref 26.0–34.0)
MCHC: 32.4 g/dL (ref 30.0–36.0)
MCV: 91.7 fL (ref 80.0–100.0)
Platelets: 101 10*3/uL — ABNORMAL LOW (ref 150–400)
RBC: 4.24 MIL/uL (ref 4.22–5.81)
RDW: 13.1 % (ref 11.5–15.5)
WBC: 5.1 10*3/uL (ref 4.0–10.5)
nRBC: 0 % (ref 0.0–0.2)

## 2022-03-01 LAB — LIPASE, BLOOD: Lipase: 29 U/L (ref 11–51)

## 2022-03-01 MED ORDER — OXYCODONE HCL 5 MG PO TABS
5.0000 mg | ORAL_TABLET | Freq: Four times a day (QID) | ORAL | 0 refills | Status: DC | PRN
Start: 2022-03-01 — End: 2022-03-06

## 2022-03-01 MED ORDER — MORPHINE SULFATE (PF) 4 MG/ML IV SOLN
4.0000 mg | Freq: Once | INTRAVENOUS | Status: AC
  Administered 2022-03-01: 4 mg via INTRAVENOUS
  Filled 2022-03-01: qty 1

## 2022-03-01 MED ORDER — ONDANSETRON HCL 4 MG/2ML IJ SOLN
4.0000 mg | Freq: Once | INTRAMUSCULAR | Status: AC
Start: 2022-03-01 — End: 2022-03-01
  Administered 2022-03-01: 4 mg via INTRAVENOUS
  Filled 2022-03-01: qty 2

## 2022-03-01 MED ORDER — SODIUM CHLORIDE 0.9 % IV BOLUS
1000.0000 mL | Freq: Once | INTRAVENOUS | Status: AC
  Administered 2022-03-01: 1000 mL via INTRAVENOUS

## 2022-03-01 NOTE — ED Provider Notes (Signed)
[facility] Provider Note    Event Date/Time   First MD Initiated Contact with Patient 03/01/22 0945     (approximate)  History   Chief Complaint: Abdominal Pain  HPI  Lynward Bourbon is a 71 y.o. male with a past medical history of CHF, diabetes, gastric reflux, ulcerative colitis status post colectomy with colostomy who presents to the emergency department for right lower quadrant abdominal pain.  According to the patient for the past 4 to 5 days patient has been experiencing pain in the right lower quadrant in the area of his colostomy.  Continues to have good ostomy output.  Patient states some degree of chronic pain to this area but this appears to be increased.  Denies any vomiting denies any fever.  No urinary symptoms.  Physical Exam   Triage Vital Signs: ED Triage Vitals  Enc Vitals Group     BP 03/01/22 0902 133/65     Pulse Rate 03/01/22 0902 (!) 55     Resp 03/01/22 0902 18     Temp 03/01/22 0902 97.8 F (36.6 C)     Temp Source 03/01/22 0902 Oral     SpO2 03/01/22 0902 100 %     Weight 03/01/22 0903 190 lb (86.2 kg)     Height 03/01/22 0903 5\' 10"  (1.778 m)     Head Circumference --      Peak Flow --      Pain Score 03/01/22 0902 10     Pain Loc --      Pain Edu? --      Excl. in [place]? --     Most recent vital signs: Vitals:   03/01/22 0902  BP: 133/65  Pulse: (!) 55  Resp: 18  Temp: 97.8 F (36.6 C)  SpO2: 100%    General: Awake, no distress.  CV:  Good peripheral perfusion.  Regular rate and rhythm  Resp:  Normal effort.  Equal breath sounds bilaterally.  Abd:  No distention.  Soft, mild to moderate tenderness in the right lower quadrant mostly surrounding the ostomy site.  Good ostomy output.    ED Results / Procedures / Treatments   RADIOLOGY  I have reviewed the CT images, no obvious significant abnormality seen on my evaluation.  Ostomy is present. Radiology is read the CT scan as cutaneous thickening around the  antrostomy site possibly from inflammation.  Also portosystemic collateral venous findings around the colostomy site.   MEDICATIONS ORDERED IN ED: Medications  morphine (PF) 4 MG/ML injection 4 mg (has no administration in time range)  ondansetron (ZOFRAN) injection 4 mg (has no administration in time range)  sodium chloride 0.9 % bolus 1,000 mL (has no administration in time range)     IMPRESSION / MDM / ASSESSMENT AND PLAN / ED COURSE  I reviewed the triage vital signs and the nursing notes.  Patient's presentation is most consistent with acute presentation with potential threat to life or bodily function.  Patient presents to the emergency department for right lower quadrant abdominal pain.  Patient has a history of ulcerative colitis status post colectomy with a colostomy in place.  Continues to have good output from the colostomy.  Patient states some degree of chronic pain to this area but has been worse recently.  Patient's lab work is overall reassuring including a normal CBC with a normal white blood cell count.  Chemistry shows mild renal insufficiency unchanged from historical values.  Urinalysis is normal.  We will treat  the patient's pain, IV hydrate and proceed with CT imaging of the abdomen/pelvis to further evaluate.  Patient agreeable plan of care.  Differential would include ileitis, colitis, chronic pain, less likely appendicitis given the patient's history of surgery, less likely biliary disease given no right upper quadrant tenderness.  Patient's lab work shows renal insufficiency, CBC is normal including a normal white blood cell count.  Lipase is normal.  No concerning findings on the urine.  CT scan shows possible inflammation at the ostomy site.  I spoke to Dr. Lysle Pearl who has been down to see the patient as well.  Believes this could be the cause of some of his discomfort but no sign of any active infection, could be more vascular congestion.  We will discharge with short  course of pain medication.  We will refer to GI medicine.  Patient agreeable to plan of care.  FINAL CLINICAL IMPRESSION(S) / ED DIAGNOSES   Right lower quadrant abdominal pain   Note:  This document was prepared using Dragon voice recognition software and may include unintentional dictation errors.   Harvest Dark, MD 03/01/22 [phone]

## 2022-03-01 NOTE — Consult Note (Signed)
Subjective:   CC: stomal pain  HPI:  Alexander Cunningham is a 71 y.o. male who was consulted by [facility] for evaluation of above.  First noted 2 days ago.  Symptoms include: Pain is sharp, intermittent, radiates toward lateral aspect, located around stoma site.  Exacerbated by nothing.  Alleviated by nothing, including tylenol.  Associated with slight increase bulge in area.  Ostomy continues to be productive and patient does not report any change in stoma appearance.    Denies melena or any changes in BM   Past Medical History:  has a past medical history of CHF (congestive heart failure) ([place]), Diabetes mellitus without complication ([place]), GERD (gastroesophageal reflux disease), and Ulcerative colitis ([place]).  Past Surgical History:  has a past surgical history that includes Colon surgery; Hernia repair; and Esophagogastroduodenoscopy (N/A, 08/17/2021).  Family History: family history is not on file.  Social History:  reports that he has quit smoking. His smoking use included cigarettes. He has never used smokeless tobacco. He reports current alcohol use. He reports that he does not use drugs.  Current Medications:  Prior to Admission medications   Medication Sig Start Date End Date Taking? Authorizing Provider  ferrous sulfate 325 (65 FE) MG tablet Take 325 mg by mouth every morning. 01/04/21   [provider]  gabapentin (NEURONTIN) 300 MG capsule Take 300 mg by mouth 3 (three) times daily. 05/16/21   [provider]  insulin aspart (NOVOLOG) 100 UNIT/ML FlexPen Inject 15 Units into the skin in the morning, at noon, and at bedtime.    [provider]  Insulin Glargine (BASAGLAR KWIKPEN) 100 UNIT/ML Inject 40-50 Units into the skin See admin instructions. Inject 50u under the skin every morning and inject 40u under the skin every night    [provider]  lisinopril (ZESTRIL) 5 MG tablet Take 5 mg by mouth every other day. 04/17/21   [provider]   magnesium oxide (MAG-OX) 400 MG tablet Take 1 tablet by mouth 2 (two) times daily. 05/03/21   [provider]  omeprazole (PRILOSEC) 40 MG capsule Take 40 mg by mouth daily. 05/26/21   [provider]  oxyCODONE (OXY IR/ROXICODONE) 5 MG immediate release tablet Take 1 tablet (5 mg total) by mouth every 6 (six) hours as needed for moderate pain or severe pain. Patient not taking: Reported on 08/17/2021 06/11/21   Shelly Coss, MD  propranolol (INDERAL) 10 MG tablet Take 10 mg by mouth 2 (two) times daily. 05/14/21   [provider]    Allergies:  Allergies  Allergen Reactions   Contrast Media [Iodinated Contrast Media] Rash   Tricor [Fenofibrate] Rash    ROS:  General: Denies weight loss, weight gain, fatigue, fevers, chills, and night sweats. Eyes: Denies blurry vision, double vision, eye pain, itchy eyes, and tearing. Ears: Denies hearing loss, earache, and ringing in ears. Nose: Denies sinus pain, congestion, infections, runny nose, and nosebleeds. Mouth/throat: Denies hoarseness, sore throat, bleeding gums, and difficulty swallowing. Heart: Denies chest pain, palpitations, racing heart, irregular heartbeat, leg pain or swelling, and decreased activity tolerance. Respiratory: Denies breathing difficulty, shortness of breath, wheezing, cough, and sputum. GI: Denies change in appetite, heartburn, nausea, vomiting, constipation, diarrhea, and blood in stool. GU: Denies difficulty urinating, pain with urinating, urgency, frequency, blood in urine. Musculoskeletal: Denies joint stiffness, pain, swelling, muscle weakness. Skin: Denies rash, itching, mass, tumors, sores, and boils Neurologic: Denies headache, fainting, dizziness, seizures, numbness, and tingling. Psychiatric: Denies depression, anxiety, difficulty sleeping, and memory loss.  Endocrine: Denies heat or cold intolerance, and increased thirst or urination. Blood/lymph: Denies easy bruising, easy  bruising, and swollen glands     Objective:     BP 133/65 (BP Location: Right Arm)   Pulse (!) 55   Temp 97.8 F (36.6 C) (Oral)   Resp 18   Ht 5\' 10"  (1.778 m)   Wt 86.2 kg   SpO2 100%   BMI 27.26 kg/m   Constitutional :  alert, cooperative, appears stated age, and no distress  Lymphatics/Throat:  no asymmetry, masses, or scars  Respiratory:  clear to auscultation bilaterally  Cardiovascular:  regular rate and rhythm  Gastrointestinal: Soft, no guarding, intermittent TTP with palpation around stoma bag.  Continues to be productive stool, patent ostomy  .  Musculoskeletal: Steady gait and movement  Skin: Cool and moist  Psychiatric: Normal affect, non-agitated, not confused       LABS:     Latest Ref Rng & Units 03/01/2022    9:07 AM 06/11/2021   10:04 AM 06/10/2021    4:55 AM  CMP  Glucose 70 - 99 mg/dL 154   305   213    BUN 8 - 23 mg/dL 23   31   39    Creatinine 0.61 - 1.24 mg/dL 1.55   1.58   2.17    Sodium 135 - 145 mmol/L 135   138   138    Potassium 3.5 - 5.1 mmol/L 4.6   4.7   4.0    Chloride 98 - 111 mmol/L 111   110   109    CO2 22 - 32 mmol/L 19   22   23     Calcium 8.9 - 10.3 mg/dL 9.9   8.8   9.2    Total Protein 6.5 - 8.1 g/dL 7.2      Total Bilirubin 0.3 - 1.2 mg/dL 1.0      Alkaline Phos 38 - 126 U/L 69      AST 15 - 41 U/L 33      ALT 0 - 44 U/L 26          Latest Ref Rng & Units 03/01/2022    9:07 AM 06/11/2021    5:00 AM 06/10/2021   10:22 AM  CBC  WBC 4.0 - 10.5 K/uL 5.1   4.0   6.0    Hemoglobin 13.0 - 17.0 g/dL 12.6   11.6   12.1    Hematocrit 39.0 - 52.0 % 38.9   34.4   35.0    Platelets 150 - 400 K/uL 101   72   83      RADS: CLINICAL DATA:  Right lower quadrant abdominal pain around the colostomy site.   EXAM: CT ABDOMEN AND PELVIS WITHOUT CONTRAST   TECHNIQUE: Multidetector CT imaging of the abdomen and pelvis was performed following the standard protocol without IV contrast.   RADIATION DOSE REDUCTION: This exam was performed  according to the departmental dose-optimization program which includes automated exposure control, adjustment of the mA and/or kV according to patient size and/or use of iterative reconstruction technique.   COMPARISON:  Abdominal ultrasound 07/05/2021   FINDINGS: Lower chest: Emphysema. Cylindrical bronchiectasis in the right lower lobe. Mild cardiomegaly. Descending aortic atherosclerotic calcification.   Hepatobiliary: Nodular liver contour compatible with cirrhosis. Nonvisualized gallbladder, presumed surgically absent. No biliary dilatation.   Pancreas: Unremarkable   Spleen: The spleen measures 12.3 by 6.7 by 13.1 cm (volume = 570 cm^3), compatible with splenomegaly.  Adrenals/Urinary Tract: 2 mm left kidney lower pole nonobstructive renal calculus on image 56 series 5. Fluid density lesion favoring cyst in the left kidney upper pole the prostate gland indents the bladder base.   Stomach/Bowel: Wall thickening in the stomach is nonspecific but probably from nondistention. Colectomy noted with enterostomy in the right lower quadrant. Dilated portosystemic collateral vessel noted accompanying the bowel along the ostomy site. There is cutaneous thickening along the ostomy site such that local cutaneous inflammation is a distinct possibility. No abnormal gas tracking in the soft tissues in this region. No abscess observed.   Vascular/Lymphatic: Atherosclerosis is present, including aortoiliac atherosclerotic disease.   Reproductive: Mild prostatomegaly. Indistinct right spermatic cord, query right orchectomy.   Other: No supplemental non-categorized findings.   Musculoskeletal: Unremarkable   IMPRESSION: 1. Colectomy with right lower quadrant enterostomy noted. There is cutaneous thickening around the enterostomy site which could be from inflammation. Also, there a dilated portosystemic collateral venous structure which extends through the colostomy site. 2. Other  imaging findings of potential clinical significance: Aortic Atherosclerosis (ICD10-I70.0) and Emphysema (ICD10-J43.9). Select all bronchiectasis in the right lower lobe. Mild cardiomegaly. Hepatic cirrhosis. Vascular findings of portal venous hypertension. 2 mm left kidney lower pole nonobstructive renal calculus. Mild prostatomegaly. Indistinct right spermatic cord, query prior right orchectomy.   /     Electronically Signed   By: Van Clines M.D.   On: 03/01/2022 10:38  Assessment:      Parastomal pain  Plan:     Possibly from increased venous congestion due to liver etiology, especially given the prominent collateral vessels reported on CT.  Pt has hx of liver disease with episode of what sounds like bleeding from a collateral vessel before at ostomy site.  He currently has no signs of acute bleeding, parastomal hernia formation, or ischemic colon.  Recommend f/u with GI to see if portal hypertension is worsening and causing the dilated vessels noted on CT.  No surgical intervention needed at this time since.  Pain control in the meantime and with continued monitoring of area for any new issues.  This should be safe to do as an outpt basis.    The patient verbalized understanding and all questions were answered to the patient's satisfaction, agreeable to f/u plan.  F/u surgery prn.  labs/images/medications/previous chart entries reviewed personally and relevant changes/updates noted above.

## 2022-03-01 NOTE — ED Notes (Signed)
See triage note. Pt states pain centered around his colostomy site. Last changed on Tuesday. Pt denies fever/chills, nausea, vomiting. Upon assessment, Pt A&Ox4, NAD.

## 2022-03-01 NOTE — ED Triage Notes (Signed)
Pt here with RLQ abd pain that started Tues afternoon while mowing his yard. Pt denies N/V but has some watery diarrhea. Pt has a colostomy as well. Pt states pain is constant and does not radiate.

## 2022-03-05 ENCOUNTER — Other Ambulatory Visit: Payer: Self-pay | Admitting: Gastroenterology

## 2022-03-05 ENCOUNTER — Ambulatory Visit: Admission: RE | Admit: 2022-03-05 | Payer: Medicare Other | Source: Ambulatory Visit

## 2022-03-05 ENCOUNTER — Emergency Department
Admission: RE | Admit: 2022-03-05 | Discharge: 2022-03-05 | Disposition: A | Discharge status: Home / Self Care | Payer: Medicare Other | Source: Ambulatory Visit | Attending: Gastroenterology | Admitting: Gastroenterology

## 2022-03-05 ENCOUNTER — Other Ambulatory Visit: Payer: Self-pay

## 2022-03-05 ENCOUNTER — Emergency Department
Admission: EM | Admit: 2022-03-05 | Discharge: 2022-03-05 | Discharge status: Against Medical Advice | Payer: Medicare Other | Attending: Gastroenterology | Admitting: Gastroenterology

## 2022-03-05 DIAGNOSIS — Z5321 Procedure and treatment not carried out due to patient leaving prior to being seen by health care provider: Secondary | ICD-10-CM | POA: Diagnosis not present

## 2022-03-05 DIAGNOSIS — R1084 Generalized abdominal pain: Secondary | ICD-10-CM

## 2022-03-05 DIAGNOSIS — R109 Unspecified abdominal pain: Secondary | ICD-10-CM | POA: Diagnosis present

## 2022-03-05 DIAGNOSIS — R1031 Right lower quadrant pain: Secondary | ICD-10-CM

## 2022-03-05 LAB — COMPREHENSIVE METABOLIC PANEL
ALT: 20 U/L (ref 0–44)
AST: 28 U/L (ref 15–41)
Albumin: 3.7 g/dL (ref 3.5–5.0)
Alkaline Phosphatase: 66 U/L (ref 38–126)
Anion gap: 4 — ABNORMAL LOW (ref 5–15)
BUN: 28 mg/dL — ABNORMAL HIGH (ref 8–23)
CO2: 16 mmol/L — ABNORMAL LOW (ref 22–32)
Calcium: 8.8 mg/dL — ABNORMAL LOW (ref 8.9–10.3)
Chloride: 115 mmol/L — ABNORMAL HIGH (ref 98–111)
Creatinine, Ser: 1.94 mg/dL — ABNORMAL HIGH (ref 0.61–1.24)
GFR, Estimated: 36 mL/min — ABNORMAL LOW (ref >60)
Glucose, Bld: 207 mg/dL — ABNORMAL HIGH (ref 70–99)
Potassium: 5.3 mmol/L — ABNORMAL HIGH (ref 3.5–5.1)
Sodium: 135 mmol/L (ref 135–145)
Total Bilirubin: 1 mg/dL (ref 0.3–1.2)
Total Protein: 7.5 g/dL (ref 6.5–8.1)

## 2022-03-05 LAB — CBC
HCT: 39 % (ref 39.0–52.0)
Hemoglobin: 12.3 g/dL — ABNORMAL LOW (ref 13.0–17.0)
MCH: 29.7 pg (ref 26.0–34.0)
MCHC: 31.5 g/dL (ref 30.0–36.0)
MCV: 94.2 fL (ref 80.0–100.0)
Platelets: 105 10*3/uL — ABNORMAL LOW (ref 150–400)
RBC: 4.14 MIL/uL — ABNORMAL LOW (ref 4.22–5.81)
RDW: 13.2 % (ref 11.5–15.5)
WBC: 6.1 10*3/uL (ref 4.0–10.5)
nRBC: 0 % (ref 0.0–0.2)

## 2022-03-05 LAB — LIPASE, BLOOD: Lipase: 24 U/L (ref 11–51)

## 2022-03-05 NOTE — ED Triage Notes (Signed)
Pt comes with c/o belly pain that started last week. Pt states he had a blockage and was evaluated here.   Pt states his dog jumped up on his belly and it has been hurting since. Pt states diarrhea. Pt does have colostomy.

## 2022-03-05 NOTE — ED Notes (Addendum)
Per LouAnn, pt advocate explained that pt exited the building to go next door

## 2022-03-06 ENCOUNTER — Emergency Department
Admission: EM | Admit: 2022-03-06 | Discharge: 2022-03-06 | Disposition: A | Discharge status: Home / Self Care | Payer: Medicare Other | Attending: Emergency Medicine | Admitting: Emergency Medicine

## 2022-03-06 ENCOUNTER — Other Ambulatory Visit: Payer: Self-pay

## 2022-03-06 DIAGNOSIS — L509 Urticaria, unspecified: Secondary | ICD-10-CM | POA: Diagnosis not present

## 2022-03-06 DIAGNOSIS — N189 Chronic kidney disease, unspecified: Secondary | ICD-10-CM | POA: Insufficient documentation

## 2022-03-06 DIAGNOSIS — E1122 Type 2 diabetes mellitus with diabetic chronic kidney disease: Secondary | ICD-10-CM | POA: Diagnosis not present

## 2022-03-06 DIAGNOSIS — I129 Hypertensive chronic kidney disease with stage 1 through stage 4 chronic kidney disease, or unspecified chronic kidney disease: Secondary | ICD-10-CM | POA: Insufficient documentation

## 2022-03-06 DIAGNOSIS — R109 Unspecified abdominal pain: Secondary | ICD-10-CM | POA: Diagnosis present

## 2022-03-06 LAB — COMPREHENSIVE METABOLIC PANEL
ALT: 20 U/L (ref 0–44)
AST: 34 U/L (ref 15–41)
Albumin: 3.8 g/dL (ref 3.5–5.0)
Alkaline Phosphatase: 58 U/L (ref 38–126)
Anion gap: 5 (ref 5–15)
BUN: 22 mg/dL (ref 8–23)
CO2: 17 mmol/L — ABNORMAL LOW (ref 22–32)
Calcium: 9.5 mg/dL (ref 8.9–10.3)
Chloride: 113 mmol/L — ABNORMAL HIGH (ref 98–111)
Creatinine, Ser: 1.38 mg/dL — ABNORMAL HIGH (ref 0.61–1.24)
GFR, Estimated: 55 mL/min — ABNORMAL LOW (ref >60)
Glucose, Bld: 235 mg/dL — ABNORMAL HIGH (ref 70–99)
Potassium: 5.2 mmol/L — ABNORMAL HIGH (ref 3.5–5.1)
Sodium: 135 mmol/L (ref 135–145)
Total Bilirubin: 0.9 mg/dL (ref 0.3–1.2)
Total Protein: 7.8 g/dL (ref 6.5–8.1)

## 2022-03-06 LAB — CBC
HCT: 40.4 % (ref 39.0–52.0)
Hemoglobin: 13.2 g/dL (ref 13.0–17.0)
MCH: 29.7 pg (ref 26.0–34.0)
MCHC: 32.7 g/dL (ref 30.0–36.0)
MCV: 90.8 fL (ref 80.0–100.0)
Platelets: 119 10*3/uL — ABNORMAL LOW (ref 150–400)
RBC: 4.45 MIL/uL (ref 4.22–5.81)
RDW: 13.2 % (ref 11.5–15.5)
WBC: 5.1 10*3/uL (ref 4.0–10.5)
nRBC: 0 % (ref 0.0–0.2)

## 2022-03-06 LAB — LIPASE, BLOOD: Lipase: 25 U/L (ref 11–51)

## 2022-03-06 MED ORDER — OXYCODONE HCL 5 MG PO TABS: 5.0000 mg | ORAL_TABLET | Freq: Four times a day (QID) | ORAL | 0 refills | Status: AC | PRN

## 2022-03-06 NOTE — ED Triage Notes (Signed)
Pt c/o pain and irritation to his colostomy, burning pain in his feet, and a rash from oxycodone he was given yesterday. Pt came to the ED yesterday but left and went  back to his PCP and had an out patient CT but does not know the results yet.Alexander Cunningham

## 2022-03-06 NOTE — ED Provider Notes (Signed)
[facility] Provider Note    Event Date/Time   First MD Initiated Contact with Patient 03/06/22 1512     (approximate)   History   Chief Complaint: Abdominal Pain   HPI  Alexander Cunningham is a 71 y.o. male with a history of CKD diabetes hypertension and ulcerative colitis status post colostomy who comes ED complaining of periostomy pain.  He was seen in the ED 5 days ago for similar complaints, had labs and a CT scan at that time which were reassuring.  He was seen by surgery who felt that this may be due to venous congestion from his underlying cirrhosis.  He was discharged home with a plan to follow-up with gastroenterology.  He called the GI clinic and was offered an appointment in September.  He denies any new symptoms but is just having increased pain today due to running out of the oxycodone pain medicine that he was given 5 days ago.  No fever or chills, no chest pain or shortness of breath, no vomiting or diarrhea or constipation, no change in ostomy output.  No bloody output.  Eating normally.     Physical Exam   Triage Vital Signs: ED Triage Vitals [03/06/22 1402]  Enc Vitals Group     BP (!) 185/78     Pulse Rate 92     Resp 17     Temp 97.9 F (36.6 C)     Temp Source Oral     SpO2 97 %     Weight 182 lb (82.6 kg)     Height 5\' 10"  (1.778 m)     Head Circumference      Peak Flow      Pain Score 10     Pain Loc      Pain Edu?      Excl. in GC?     Most recent vital signs: Vitals:   03/06/22 1402  BP: (!) 185/78  Pulse: 92  Resp: 17  Temp: 97.9 F (36.6 C)  SpO2: 97%    General: Awake, no distress.  CV:  Good peripheral perfusion.  Resp:  Normal effort.  No wheezing Abd:  No distention.  Soft and nontender.  Ostomy appears intact without any hernia.  Mucosa is pink and moist, there is thin light brown output.  Ostomy was digitized and soft. Other:  Urticarial rash, which patient attributes to oxycodone   ED Results /  Procedures / Treatments   Labs (all labs ordered are listed, but only abnormal results are displayed) Labs Reviewed  COMPREHENSIVE METABOLIC PANEL - Abnormal; Notable for the following components:      Result Value   Potassium 5.2 (*)    Chloride 113 (*)    CO2 17 (*)    Glucose, Bld 235 (*)    Creatinine, Ser 1.38 (*)    GFR, Estimated 55 (*)    All other components within normal limits  CBC - Abnormal; Notable for the following components:   Platelets 119 (*)    All other components within normal limits  LIPASE, BLOOD  URINALYSIS, ROUTINE W REFLEX MICROSCOPIC     EKG    RADIOLOGY CT abdomen pelvis from earlier today viewed and interpreted by me, appears normal.  Radiology report reviewed, no obstruction or parastomal hernia, no other incarcerated hernia.   PROCEDURES:  Procedures   MEDICATIONS ORDERED IN ED: Medications - No data to display   IMPRESSION / MDM / ASSESSMENT AND PLAN / ED COURSE  I reviewed the triage vital signs and the nursing notes.                              Differential diagnosis includes, but is not limited to, vascular congestion, bowel obstruction, hernia, intra-abdominal abscess  Patient's presentation is most consistent with exacerbation of chronic illness.  Patient presents with right lower quadrant abdominal pain in the area of his colostomy.  I reviewed labs and CT scan from 5 days ago along with labs today and yesterday and CT results from today.  No significant findings and no interval change over the last 4 days.  Vital signs are normal, he is nontoxic, exam is reassuring.  on reviewing Dr. Geoffery Lyons surgery consult note, his main recommendation is pain control, so I will provide a limited refill of oxycodone since that is working well for pain control.  Considering the patient's symptoms, medical history, and physical examination today, I have low suspicion for cholecystitis or biliary pathology, pancreatitis, perforation or bowel  obstruction, hernia, intra-abdominal abscess, AAA or dissection, volvulus or intussusception, mesenteric ischemia, or appendicitis. No evidence of ostomy site infection.  Not requiring admission.       FINAL CLINICAL IMPRESSION(S) / ED DIAGNOSES   Final diagnoses:  Abdominal pain, unspecified abdominal location     Rx / DC Orders   ED Discharge Orders          Ordered    oxyCODONE (ROXICODONE) 5 MG immediate release tablet  Every 6 hours PRN        03/06/22 1544             Note:  This document was prepared using Dragon voice recognition software and may include unintentional dictation errors.   Sharman Cheek, MD 03/06/22 [phone]

## 2022-03-06 NOTE — Discharge Instructions (Addendum)
Please follow up with gastroenterology as planned.  Try calling offices in [place], [place], or [place] to find a doctor that can see you soon.

## 2022-06-19 ENCOUNTER — Other Ambulatory Visit: Payer: Self-pay | Admitting: Gastroenterology

## 2022-06-19 DIAGNOSIS — K7460 Unspecified cirrhosis of liver: Secondary | ICD-10-CM

## 2022-06-27 ENCOUNTER — Ambulatory Visit
Admission: RE | Admit: 2022-06-27 | Discharge: 2022-06-27 | Disposition: A | Discharge status: Home / Self Care | Payer: Medicare Other | Source: Ambulatory Visit | Attending: Gastroenterology | Admitting: Gastroenterology

## 2022-06-27 DIAGNOSIS — K7460 Unspecified cirrhosis of liver: Secondary | ICD-10-CM | POA: Diagnosis not present

## 2022-06-27 MED ORDER — GADOPICLENOL 0.5 MMOL/ML IV SOLN
8.0000 mL | Freq: Once | INTRAVENOUS | Status: AC | PRN
Start: 2022-06-27 — End: 2022-06-27
  Administered 2022-06-27: 8 mL via INTRAVENOUS

## 2022-08-29 ENCOUNTER — Encounter: Payer: Self-pay | Admitting: Gastroenterology

## 2022-08-29 ENCOUNTER — Encounter: Payer: Medicare Other | Attending: Gastroenterology | Admitting: Dietician

## 2022-08-29 VITALS — Ht 70.0 in | Wt 184.7 lb

## 2022-08-29 DIAGNOSIS — N183 Chronic kidney disease, stage 3 unspecified: Secondary | ICD-10-CM | POA: Insufficient documentation

## 2022-08-29 DIAGNOSIS — I8500 Esophageal varices without bleeding: Secondary | ICD-10-CM | POA: Insufficient documentation

## 2022-08-29 DIAGNOSIS — K766 Portal hypertension: Secondary | ICD-10-CM | POA: Diagnosis not present

## 2022-08-29 DIAGNOSIS — N1831 Chronic kidney disease, stage 3a: Secondary | ICD-10-CM

## 2022-08-29 DIAGNOSIS — E119 Type 2 diabetes mellitus without complications: Secondary | ICD-10-CM | POA: Diagnosis not present

## 2022-08-29 DIAGNOSIS — K219 Gastro-esophageal reflux disease without esophagitis: Secondary | ICD-10-CM | POA: Insufficient documentation

## 2022-08-29 DIAGNOSIS — Z9049 Acquired absence of other specified parts of digestive tract: Secondary | ICD-10-CM | POA: Insufficient documentation

## 2022-08-29 DIAGNOSIS — K7460 Unspecified cirrhosis of liver: Secondary | ICD-10-CM | POA: Diagnosis present

## 2022-08-29 DIAGNOSIS — Z713 Dietary counseling and surveillance: Secondary | ICD-10-CM | POA: Diagnosis not present

## 2022-08-29 DIAGNOSIS — E1142 Type 2 diabetes mellitus with diabetic polyneuropathy: Secondary | ICD-10-CM

## 2022-08-29 NOTE — H&P (Signed)
Pre-Procedure H&P   Patient ID: Alexander Cunningham is a 71 y.o. male.  Gastroenterology Provider: [name], DO  PCP: Dorothey Baseman, MD  Date: 08/30/2022  HPI [name] is a 71 y.o. male who presents today for Esophagogastroduodenoscopy for Surveillance EGD-cirrhosis, varices, portal hypertension.  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  Also has a history of ulcerative colitis status post colectomy with ileostomy.  Last underwent EGD in September 2021 with out[place]of-state GI demonstrating small varices, portal hypertensive gastropathy and a dieulafoy lesion requiring 3 clips.  Underwent EGD in October 2022 also demonstrating portal hypertensive gastropathy, F1 varices, gastritis that was negative for H. pylori and intestinal metaplasia.  Irregular Z-line negative for Barrett's esophagus  Most recent lab work creatinine 1.4 hemoglobin 12.1 MCV 96 platelets 98,000  Currently on omeprazole and propranolol   Past Medical History:  Diagnosis Date   CHF (congestive heart failure) ([facility])    Diabetes mellitus without complication ([facility])    GERD (gastroesophageal reflux disease)    Ulcerative colitis ([facility])     Past Surgical History:  Procedure Laterality Date   COLON SURGERY     ESOPHAGOGASTRODUODENOSCOPY N/A 08/17/2021   Procedure: ESOPHAGOGASTRODUODENOSCOPY (EGD);  Surgeon: [name], DO;  Location: [facility] ENDOSCOPY;  Service: Gastroenterology;  Laterality: N/A;  IDDM   HERNIA REPAIR      Family History No h/o GI disease or malignancy  Review of Systems  Constitutional:  Negative for activity change, appetite change, chills, diaphoresis, fatigue, fever and unexpected weight change.  HENT:  Negative for trouble swallowing and voice change.   Respiratory:  Negative for shortness of breath and wheezing.   Cardiovascular:  Negative for chest pain, palpitations and leg swelling.   Gastrointestinal:  Negative for abdominal distention, abdominal pain, anal bleeding, blood in stool, constipation, diarrhea, nausea and vomiting.  Musculoskeletal:  Negative for arthralgias and myalgias.  Skin:  Negative for color change and pallor.  Neurological:  Negative for dizziness, syncope and weakness.  Psychiatric/Behavioral:  Negative for confusion. The patient is not nervous/anxious.   All other systems reviewed and are negative.    Medications No current facility-administered medications on file prior to encounter.   Current Outpatient Medications on File Prior to Encounter  Medication Sig Dispense Refill   gabapentin (NEURONTIN) 300 MG capsule Take 300 mg by mouth 3 (three) times daily.     insulin aspart (NOVOLOG) 100 UNIT/ML FlexPen Inject 15 Units into the skin in the morning, at noon, and at bedtime.     Insulin Glargine (BASAGLAR KWIKPEN) 100 UNIT/ML Inject 40-50 Units into the skin See admin instructions. Inject 50u under the skin every morning and inject 40u under the skin every night     lisinopril (ZESTRIL) 5 MG tablet Take 5 mg by mouth every other day.     magnesium oxide (MAG-OX) 400 MG tablet Take 1 tablet by mouth 2 (two) times daily.     omeprazole (PRILOSEC) 40 MG capsule Take 40 mg by mouth daily.     propranolol (INDERAL) 10 MG tablet Take 10 mg by mouth 2 (two) times daily.     ferrous sulfate 325 (65 FE) MG tablet Take 325 mg by mouth every morning.      Pertinent medications related to GI and procedure were reviewed by me with the patient prior to the procedure   Current Facility-Administered Medications:    0.9 %  sodium chloride infusion, , Intravenous, Continuous, Timothy Lasso,  Thomas Hoff, DO, Last Rate: 20 mL/hr at 08/30/22 0922, New Bag at 08/30/22 3149      Allergies  Allergen Reactions   Contrast Media [Iodinated Contrast Media] Rash   Other Rash    Pt states the bed sheets at the hospital gave him a bad purple rash on his back.   Oxycodone  Hives   Tricor [Fenofibrate] Rash   Allergies were reviewed by me prior to the procedure  Objective   Body mass index is 26.45 kg/m. Vitals:   08/30/22 0850  BP: 133/60  Pulse: (!) 55  Resp: 18  Temp: (!) 96.2 F (35.7 C)  TempSrc: Temporal  SpO2: 100%  Weight: 83.6 kg  Height: 5\' 10"  (1.778 m)     Physical Exam Vitals and nursing note reviewed.  Constitutional:      General: He is not in acute distress.    Appearance: Normal appearance. He is not ill-appearing, toxic-appearing or diaphoretic.  HENT:     Head: Normocephalic and atraumatic.     Nose: Nose normal.     Mouth/Throat:     Mouth: Mucous membranes are moist.     Pharynx: Oropharynx is clear.  Eyes:     General: No scleral icterus.    Extraocular Movements: Extraocular movements intact.  Cardiovascular:     Rate and Rhythm: Regular rhythm. Bradycardia present.     Heart sounds: Normal heart sounds. No murmur heard.    No friction rub. No gallop.  Pulmonary:     Effort: Pulmonary effort is normal. No respiratory distress.     Breath sounds: Normal breath sounds. No wheezing, rhonchi or rales.  Abdominal:     General: Bowel sounds are normal. There is no distension.     Palpations: Abdomen is soft.     Tenderness: There is no abdominal tenderness. There is no guarding or rebound.  Musculoskeletal:     Cervical back: Neck supple.     Right lower leg: No edema.     Left lower leg: No edema.  Skin:    General: Skin is warm and dry.     Coloration: Skin is not jaundiced or pale.  Neurological:     General: No focal deficit present.     Mental Status: He is alert and oriented to person, place, and time. Mental status is at baseline.  Psychiatric:        Mood and Affect: Mood normal.        Behavior: Behavior normal.        Thought Content: Thought content normal.        Judgment: Judgment normal.      Assessment:  [name] is a 71 y.o. male  who presents today for  Esophagogastroduodenoscopy for varices surveillance, cirrhosis, portal hypertension.  Plan:  Esophagogastroduodenoscopy with possible intervention today  Esophagogastroduodenoscopy with possible biopsy, control of bleeding, polypectomy, and interventions as necessary has been discussed with the patient/patient representative. Informed consent was obtained from the patient/patient representative after explaining the indication, nature, and risks of the procedure including but not limited to death, bleeding, perforation, missed neoplasm/lesions, cardiorespiratory compromise, and reaction to medications. Opportunity for questions was given and appropriate answers were provided. Patient/patient representative has verbalized understanding is amenable to undergoing the procedure.   61, DO  [facility]  Portions of the record may have been created with voice recognition software. Occasional wrong-word or 'sound-a-like' substitutions may have occurred due to the inherent limitations of voice recognition software.  Read the chart carefully and recognize, using context, where substitutions may have occurred.

## 2022-08-29 NOTE — Progress Notes (Signed)
Medical Nutrition Therapy: Visit start time: 1045  end time: 1210  Assessment:   Referral Diagnosis: cirrhosis Other medical history/ diagnoses: colostomy, diabetes, GERD, CKD stage 3 Psychosocial issues/ stress concerns: none  Medications, supplements: reconciled list in medical record   Preferred learning method:  Auditory/ discussion   Current weight: 184.7lbs Height: 5'10" BMI: 26.5 Patient's personal weight goal: 165  Progress and evaluation:  Patient reports colectomy 7 years ago (wears colostomy bag), was put on low fiber diet (no skins including fruit and veg) long term. Currently having blockages in colostomy, he feels due to high carb diet Has abdominal pain frequently after eating; reports some scar tissue at pyloric valve which is causing pain. Reports prior hospitalization due to high potassium after eating 1 banana daily; does still eat potatoes in small portions. Reports some recent weight gain, was about 165 about 3 months ago. GERD severity has not changed.  Reports diabetes diagnosis 15 years ago. He tests BGs several times daily and reports widely fluctuating results, some hypoglycemia episodes during the night with night sweats, often high fasting readings. Was truck driver for many years.   Recent labs indicate (06/22/22) sodium 139, potassium 4.1, chloride 110, BUN 22, creatinine 1.4, GFR 50, (03/01/22 HbA1C 7.7%) Food allergies: none Special diet practices: none Patient seeks help with what he can/ should eat given multiple health issues affecting diet    Dietary Intake:  Usual eating pattern includes 2-3 meals and 1-2 snacks per day. Dining out frequency: 2 meals per week. Who plans meals? Self, spouse  buys groceries? spouse Who prepares meals? Self, spouse  Breakfast: 7am toast sometimes with 2 scrambled eggs or sausage patty, coffee Snack: none Lunch: sometimes skips; sometimes crackers and cheese or peanut butter; yogurt Snack: crackers or similar if no  lunch Supper: pork chop/ salmon/ chicken/ chicken fried steak (eats half) + mashed pot @ [employer]; tuna or egg salad sandwich Snack: sometimes snacks when waking up during the night with BGs in 40s-50s Beverages: water, coffee in am, rarely hot tea  Physical activity: no regular exercise; sometimes short walks, none during cold weather   Intervention:   Nutrition Care Education:   Basic nutrition: basic food groups; appropriate nutrient balance; appropriate meal and snack schedule; general nutrition guidelines    Weight control: importance of low sugar and low fat choices; appropriate portions; estimated energy needs at 1500-1600 kcal, provided guidance for 50% CHO, 20% pro, 30% fat -- to achieve adequate balance for BG control, renal health, liver health, and minimizing GI issues Cirrhosis: low fat food choices, minimizing added fats; controlling processed starches and added sugars, consuming small, frequent meals and snacks Colostomy: increasing fiber slowly, ensuring adequate fluid intake; chewing foods thoroughly; foods that increase risk of blockage, increase gas odor; thicken or thin stools Diabetes:  goals for BGs; appropriate meal and snack schedule; appropriate carb intake and balance, healthy carb choices; role of fiber, protein, fat; physical activity; effects of stress CKD: avoiding large portions of protein foods, limiting high sodium foods  Other intervention notes: Patient's main concern is abdominal pain and blockages. He has been limiting fiber since having colostomy.  Established goals for change with input from patient, including goal of gradual increase in fiber and adequate fluid intake. Due to pending upper endoscopy, patient will wait until after next medical visit to determine need for MNT follow up.   Nutritional Diagnosis:  [place]-2.1 Inpaired nutrition utilization and [place]-2.2 Altered nutrition-related laboratory As related to cirrhosis, Type 2 Diabetes,  CKD,  and GERD .  As evidenced by imaging scans, elevated HbA1C, low GFR, and patient reported symptoms. [place]-1.4 Altered GI function As related to colectomy.  As evidenced by colostomy, patient reported symptoms of GERD.   Education Materials given:  Colostomy Nutrition Therapy Designer, industrial/product with food lists, sample meal pattern Visit summary with goals/ instructions   Learner/ who was taught:  Patient    Level of understanding: Verbalizes/ demonstrates basic competency; advise RD follow up  Demonstrated degree of understanding via:   Teach back Learning barriers: None  Willingness to learn/ readiness for change: Acceptance, ready for change   Monitoring and Evaluation:  Dietary intake, exercise, GI symptoms, liver function, BG control, renal function, and body weight      follow up: in 2-3 months

## 2022-08-29 NOTE — Patient Instructions (Signed)
Eat a small meal or snack every 2-4 hours during the day.  Keep portions of starchy foods small (potatoes, rice, pasta) great job so far! Limit extra fats, including butter (1-2 teaspoons at any one meal) Allow for small amounts of cooked vegetables or canned or peeled fruits with each meal. Start with 1-2 bites and gradually increase as tolerated.  Light movement for 10-15 minutes after eating sometimes helps the digestive process.

## 2022-08-30 ENCOUNTER — Ambulatory Visit
Admission: RE | Admit: 2022-08-30 | Discharge: 2022-08-30 | Disposition: A | Discharge status: Home / Self Care | Payer: Medicare Other | Attending: Gastroenterology | Admitting: Gastroenterology

## 2022-08-30 ENCOUNTER — Encounter
Admission: RE | Disposition: A | Discharge status: Home / Self Care | Payer: Self-pay | Source: Home / Self Care | Attending: Gastroenterology

## 2022-08-30 ENCOUNTER — Ambulatory Visit: Payer: Medicare Other | Admitting: Certified Registered Nurse Anesthetist

## 2022-08-30 ENCOUNTER — Encounter: Payer: Self-pay | Admitting: Gastroenterology

## 2022-08-30 DIAGNOSIS — Z91041 Radiographic dye allergy status: Secondary | ICD-10-CM | POA: Diagnosis not present

## 2022-08-30 DIAGNOSIS — K219 Gastro-esophageal reflux disease without esophagitis: Secondary | ICD-10-CM | POA: Diagnosis not present

## 2022-08-30 DIAGNOSIS — K3189 Other diseases of stomach and duodenum: Secondary | ICD-10-CM | POA: Diagnosis not present

## 2022-08-30 DIAGNOSIS — Z794 Long term (current) use of insulin: Secondary | ICD-10-CM | POA: Diagnosis not present

## 2022-08-30 DIAGNOSIS — I509 Heart failure, unspecified: Secondary | ICD-10-CM | POA: Diagnosis not present

## 2022-08-30 DIAGNOSIS — K7460 Unspecified cirrhosis of liver: Secondary | ICD-10-CM | POA: Diagnosis not present

## 2022-08-30 DIAGNOSIS — K2289 Other specified disease of esophagus: Secondary | ICD-10-CM | POA: Diagnosis not present

## 2022-08-30 DIAGNOSIS — K5190 Ulcerative colitis, unspecified, without complications: Secondary | ICD-10-CM | POA: Diagnosis not present

## 2022-08-30 DIAGNOSIS — K766 Portal hypertension: Secondary | ICD-10-CM | POA: Diagnosis not present

## 2022-08-30 DIAGNOSIS — Z79899 Other long term (current) drug therapy: Secondary | ICD-10-CM | POA: Diagnosis not present

## 2022-08-30 DIAGNOSIS — I130 Hypertensive heart and chronic kidney disease with heart failure and stage 1 through stage 4 chronic kidney disease, or unspecified chronic kidney disease: Secondary | ICD-10-CM | POA: Insufficient documentation

## 2022-08-30 DIAGNOSIS — K760 Fatty (change of) liver, not elsewhere classified: Secondary | ICD-10-CM | POA: Diagnosis not present

## 2022-08-30 DIAGNOSIS — Z885 Allergy status to narcotic agent status: Secondary | ICD-10-CM | POA: Insufficient documentation

## 2022-08-30 DIAGNOSIS — K31819 Angiodysplasia of stomach and duodenum without bleeding: Secondary | ICD-10-CM | POA: Diagnosis not present

## 2022-08-30 DIAGNOSIS — Z9049 Acquired absence of other specified parts of digestive tract: Secondary | ICD-10-CM | POA: Diagnosis not present

## 2022-08-30 DIAGNOSIS — G709 Myoneural disorder, unspecified: Secondary | ICD-10-CM | POA: Diagnosis not present

## 2022-08-30 DIAGNOSIS — I8500 Esophageal varices without bleeding: Secondary | ICD-10-CM | POA: Diagnosis present

## 2022-08-30 DIAGNOSIS — Z87891 Personal history of nicotine dependence: Secondary | ICD-10-CM | POA: Diagnosis not present

## 2022-08-30 DIAGNOSIS — E1122 Type 2 diabetes mellitus with diabetic chronic kidney disease: Secondary | ICD-10-CM | POA: Insufficient documentation

## 2022-08-30 DIAGNOSIS — I8510 Secondary esophageal varices without bleeding: Secondary | ICD-10-CM | POA: Diagnosis not present

## 2022-08-30 HISTORY — PX: ESOPHAGOGASTRODUODENOSCOPY: SHX5428

## 2022-08-30 LAB — GLUCOSE, CAPILLARY: Glucose-Capillary: 132 mg/dL — ABNORMAL HIGH (ref 70–99)

## 2022-08-30 SURGERY — EGD (ESOPHAGOGASTRODUODENOSCOPY)
Anesthesia: General

## 2022-08-30 MED ORDER — SODIUM CHLORIDE 0.9 % IV SOLN: INTRAVENOUS | Status: DC

## 2022-08-30 MED ORDER — PROPOFOL 500 MG/50ML IV EMUL
INTRAVENOUS | Status: DC | PRN
  Administered 2022-08-30: 175 ug/kg/min via INTRAVENOUS

## 2022-08-30 MED ORDER — LIDOCAINE HCL (CARDIAC) PF 100 MG/5ML IV SOSY
PREFILLED_SYRINGE | INTRAVENOUS | Status: DC | PRN
  Administered 2022-08-30: 50 mg via INTRAVENOUS

## 2022-08-30 MED ORDER — PROPOFOL 10 MG/ML IV BOLUS
INTRAVENOUS | Status: DC | PRN
  Administered 2022-08-30: 80 mg via INTRAVENOUS
  Administered 2022-08-30: 10 mg via INTRAVENOUS

## 2022-08-30 NOTE — Anesthesia Postprocedure Evaluation (Signed)
Anesthesia Post Note  Patient: Alexander Cunningham  Procedure(s) Performed: ESOPHAGOGASTRODUODENOSCOPY (EGD)  Patient location during evaluation: Endoscopy Anesthesia Type: General Level of consciousness: awake and alert Pain management: pain level controlled Vital Signs Assessment: post-procedure vital signs reviewed and stable Respiratory status: spontaneous breathing, nonlabored ventilation, respiratory function stable and patient connected to nasal cannula oxygen Cardiovascular status: blood pressure returned to baseline and stable Postop Assessment: no apparent nausea or vomiting Anesthetic complications: no   No notable events documented.   Last Vitals:  Vitals:   08/30/22 1017 08/30/22 1027  BP:  114/60  Pulse: 62 60  Resp:    Temp:    SpO2: 100% 100%    Last Pain:  Vitals:   08/30/22 1027  TempSrc:   PainSc: 0-No pain                 Corinda Gubler

## 2022-08-30 NOTE — Op Note (Signed)
[facility] Gastroenterology Patient Name: Alexander Cunningham Procedure Date: 08/30/2022 9:42 AM MRN: [id number] Account #: [id number] Date of Birth: [birth year] Admit Type: Outpatient Age: 71 Room: [facility] ENDO ROOM 1 Gender: Male Note Status: Finalized Instrument Name: Michaelle Birks [id number] Procedure:             Upper GI endoscopy Indications:           Surveillance procedure, Follow-up of esophageal varices Providers:             Rueben Bash, DO Referring MD:          Youlanda Roys. Lovie Macadamia, MD (Referring MD) Medicines:             Monitored Anesthesia Care Complications:         No immediate complications. Estimated blood loss: None. Procedure:             Pre-Anesthesia Assessment:                        - Prior to the procedure, a History and Physical was                         performed, and patient medications and allergies were                         reviewed. The patient is competent. The risks and                         benefits of the procedure and the sedation options and                         risks were discussed with the patient. All questions                         were answered and informed consent was obtained.                         Patient identification and proposed procedure were                         verified by the physician, the nurse, the anesthetist                         and the technician in the endoscopy suite. Mental                         Status Examination: alert and oriented. Airway                         Examination: normal oropharyngeal airway and neck                         mobility. Respiratory Examination: clear to                         auscultation. CV Examination: RRR, no murmurs, no S3                         or S4. Prophylactic Antibiotics: The patient does not  require prophylactic antibiotics. Prior                         Anticoagulants: The patient has taken no anticoagulant                          or antiplatelet agents. ASA Grade Assessment: III - A                         patient with severe systemic disease. After reviewing                         the risks and benefits, the patient was deemed in                         satisfactory condition to undergo the procedure. The                         anesthesia plan was to use monitored anesthesia care                         (MAC). Immediately prior to administration of                         medications, the patient was re-assessed for adequacy                         to receive sedatives. The heart rate, respiratory                         rate, oxygen saturations, blood pressure, adequacy of                         pulmonary ventilation, and response to care were                         monitored throughout the procedure. The physical                         status of the patient was re-assessed after the                         procedure.                        After obtaining informed consent, the endoscope was                         passed under direct vision. Throughout the procedure,                         the patient's blood pressure, pulse, and oxygen                         saturations were monitored continuously. The Endoscope                         was introduced through the mouth, and advanced to the  second part of duodenum. The upper GI endoscopy was                         accomplished without difficulty. The patient tolerated                         the procedure well. Findings:      The duodenal bulb, first portion of the duodenum and second portion of       the duodenum were normal. Estimated blood loss: none.      Mild gastric antral vascular ectasia without bleeding was present in the       gastric antrum. Estimated blood loss: none.      Mild portal hypertensive gastropathy was found in the gastric fundus and       in the gastric body. Estimated blood loss:  none.      The Z-line was irregular. Previous biopsies negative for Barretts       esophagus. Area <1cm; area was not rebiopsied. Estimated blood loss:       none.      Grade I varices were found in the distal esophagus. Estimated blood       loss: none. No stigmata of recent bleeding.      Esophagogastric landmarks were identified: the gastroesophageal junction       was found at 36 cm from the incisors.      no signs of gastric varices Impression:            - Normal duodenal bulb, first portion of the duodenum                         and second portion of the duodenum.                        - Gastric antral vascular ectasia without bleeding.                        - Portal hypertensive gastropathy.                        - Z-line irregular.                        - Grade I esophageal varices.                        - Esophagogastric landmarks identified.                        - No specimens collected. Recommendation:        - Patient has a contact number available for                         emergencies. The signs and symptoms of potential                         delayed complications were discussed with the patient.                         Return to normal activities tomorrow. Written  discharge instructions were provided to the patient.                        - Discharge patient to home.                        - Resume previous diet.                        - Continue present medications.                        - Repeat upper endoscopy in 1 year for surveillance.                        - Return to GI office as previously scheduled.                        - The findings and recommendations were discussed with                         the patient. Procedure Code(s):     --- Professional ---                        [phone], Esophagogastroduodenoscopy, flexible,                         transoral; diagnostic, including collection of                          specimen(s) by brushing or washing, when performed                         (separate procedure) Diagnosis Code(s):     --- Professional ---                        H06.237, Angiodysplasia of stomach and duodenum                         without bleeding                        K76.6, Portal hypertension                        K31.89, Other diseases of stomach and duodenum                        K22.89, Other specified disease of esophagus                        I85.00, Esophageal varices without bleeding CPT copyright 2022 American Medical Association. All rights reserved. The codes documented in this report are preliminary and upon coder review may  be revised to meet current compliance requirements. Attending Participation:      I personally performed the entire procedure. Volney American, DO Annamaria Helling DO, DO 08/30/2022 10:08:18 AM This report has been signed electronically. Number of Addenda: 0 Note Initiated On: 08/30/2022 9:42 AM Estimated Blood Loss:  Estimated blood loss: none.      [facility]

## 2022-08-30 NOTE — Interval H&P Note (Signed)
History and Physical Interval Note: Preprocedure H&P from 08/30/22  was reviewed and there was no interval change after seeing and examining the patient.  Written consent was obtained from the patient after discussion of risks, benefits, and alternatives. Patient has consented to proceed with Esophagogastroduodenoscopy with possible intervention   08/30/2022 9:48 AM  Alexander Cunningham  has presented today for surgery, with the diagnosis of Cirrhosis of liver without ascites, unspecified hepatic cirrhosis type (CMS-HCC) (K74.60) Portal hypertension with esophageal varices (CMS-HCC) (K76.6,I85.00).  The various methods of treatment have been discussed with the patient and family. After consideration of risks, benefits and other options for treatment, the patient has consented to  Procedure(s): ESOPHAGOGASTRODUODENOSCOPY (EGD) (N/A) as a surgical intervention.  The patient's history has been reviewed, patient examined, no change in status, stable for surgery.  I have reviewed the patient's chart and labs.  Questions were answered to the patient's satisfaction.     [name]

## 2022-08-30 NOTE — Anesthesia Preprocedure Evaluation (Signed)
Anesthesia Evaluation  Patient identified by MRN, date of birth, ID band Patient awake  General Assessment Comment:  Limited historian  Reviewed: Allergy & Precautions, H&P , NPO status , Patient's Chart, lab work & pertinent test results, reviewed documented beta blocker date and time   Airway Mallampati: II  TM Distance: >3 FB Neck ROM: Full    Dental  (+) Lower Dentures, Partial Upper   Pulmonary neg pulmonary ROS, former smoker   Pulmonary exam normal        Cardiovascular hypertension, Pt. on medications and Pt. on home beta blockers +CHF  Normal cardiovascular exam     Neuro/Psych  Neuromuscular disease  negative psych ROS   GI/Hepatic PUD,GERD  Medicated,,(+) Cirrhosis   Esophageal Varices  substance abuse  alcohol use  Endo/Other  negative endocrine ROSdiabetes, Insulin Dependent    Renal/GU Renal disease  negative genitourinary   Musculoskeletal negative musculoskeletal ROS (+)    Abdominal   Peds negative pediatric ROS (+)  Hematology negative hematology ROS (+)   Anesthesia Other Findings  Chronic kidney disease   Diabetes mellitus without complication (CMS-HCC)   GERD (gastroesophageal reflux disease)   Ulcerative colitis (CMS-HCC)     Reproductive/Obstetrics negative OB ROS                              Anesthesia Physical Anesthesia Plan  ASA: 3  Anesthesia Plan: General   Post-op Pain Management: Minimal or no pain anticipated   Induction: Intravenous  PONV Risk Score and Plan: 2 and Propofol infusion and TIVA  Airway Management Planned: Natural Airway and Nasal Cannula  Additional Equipment: None  Intra-op Plan:   Post-operative Plan:   Informed Consent: I have reviewed the patients History and Physical, chart, labs and discussed the procedure including the risks, benefits and alternatives for the proposed anesthesia with the patient or authorized  representative who has indicated his/her understanding and acceptance.     Dental advisory given  Plan Discussed with: CRNA, Anesthesiologist and Surgeon  Anesthesia Plan Comments: (Discussed risks of anesthesia with patient, including possibility of difficulty with spontaneous ventilation under anesthesia necessitating airway intervention, PONV, and rare risks such as cardiac or respiratory or neurological events, and allergic reactions. Discussed the role of CRNA in patient's perioperative care. Patient understands.)         Anesthesia Quick Evaluation

## 2022-08-30 NOTE — Anesthesia Procedure Notes (Signed)
Date/Time: 08/30/2022 9:55 AM  Performed by: Ginger Carne, CRNAPre-anesthesia Checklist: Patient identified, Emergency Drugs available, Suction available, Patient being monitored and Timeout performed Patient Re-evaluated:Patient Re-evaluated prior to induction Oxygen Delivery Method: Nasal cannula Preoxygenation: Pre-oxygenation with 100% oxygen Induction Type: IV induction

## 2022-08-30 NOTE — Transfer of Care (Signed)
Immediate Anesthesia Transfer of Care Note  Patient: Alexander Cunningham  Procedure(s) Performed: ESOPHAGOGASTRODUODENOSCOPY (EGD)  Patient Location: PACU  Anesthesia Type:General  Level of Consciousness: sedated  Airway & Oxygen Therapy: Patient Spontanous Breathing  Post-op Assessment: Report given to RN and Post -op Vital signs reviewed and stable  Post vital signs: Reviewed and stable  Last Vitals:  Vitals Value Taken Time  BP 98/43 08/30/22 1009  Temp    Pulse 62 08/30/22 1009  Resp 17 08/30/22 1009  SpO2 100 % 08/30/22 1009    Last Pain:  Vitals:   08/30/22 1007  TempSrc: Temporal  PainSc: Asleep         Complications: No notable events documented.

## 2022-08-31 ENCOUNTER — Encounter: Payer: Self-pay | Admitting: Gastroenterology

## 2022-10-15 IMAGING — CT CT ABD-PELV W/O CM
2 of 4 series · 15 of 46 positions shown, 17 images · non-contrast
Comparison: Abdominal ultrasound 07/05/2021

CLINICAL DATA: Right lower quadrant abdominal pain around the
colostomy site.



[Series 2: ap without · axial · non-contrast · 0.74mm/px · z∈[-1104,-649]mm · 12 of 103 slices shown, 14 images]
[im 6/103  soft-tissue]
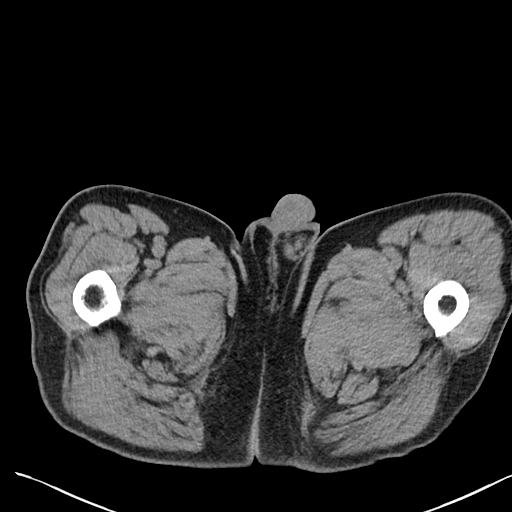
[im 6/103  bone]
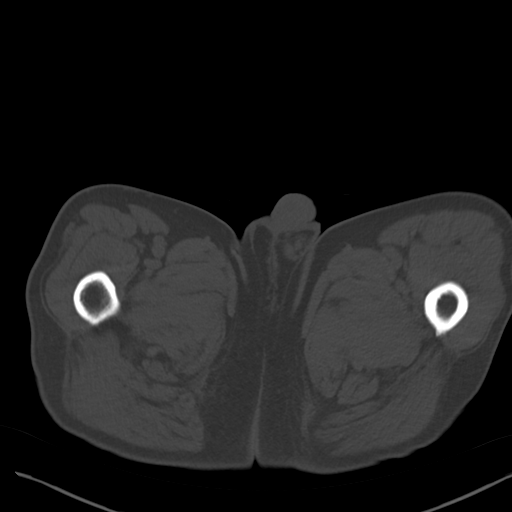
[im 18/103  soft-tissue]
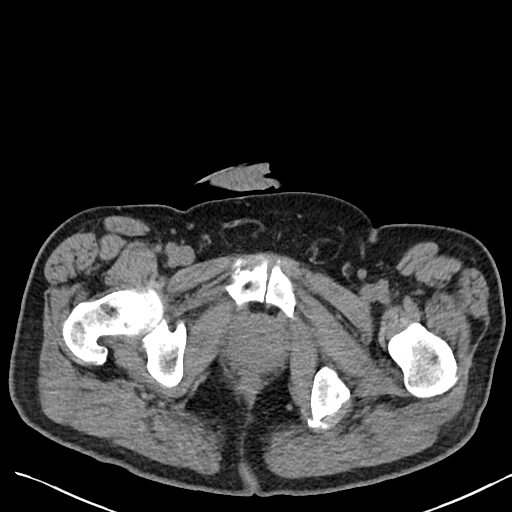
[im 23/103  soft-tissue]
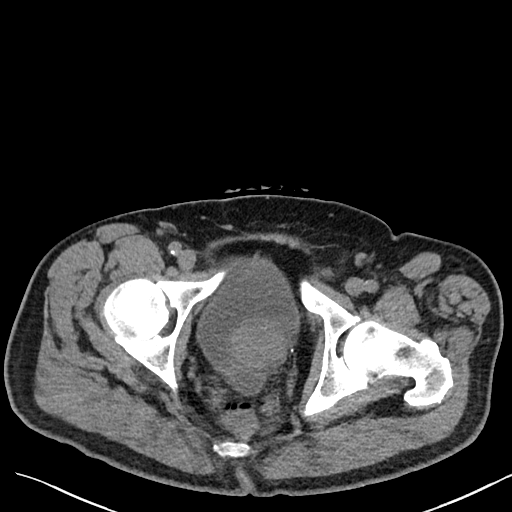
[im 29/103  soft-tissue]
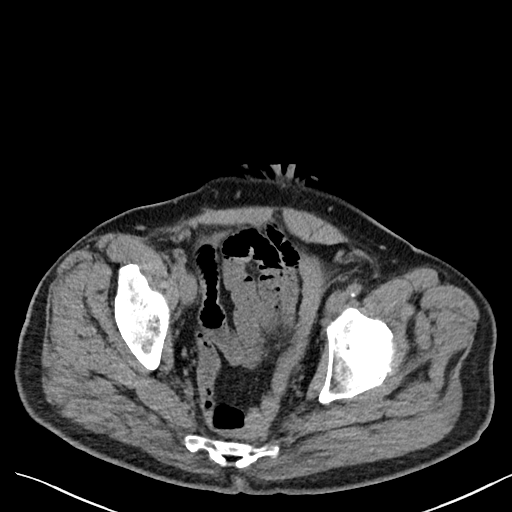
[im 40/103  soft-tissue]
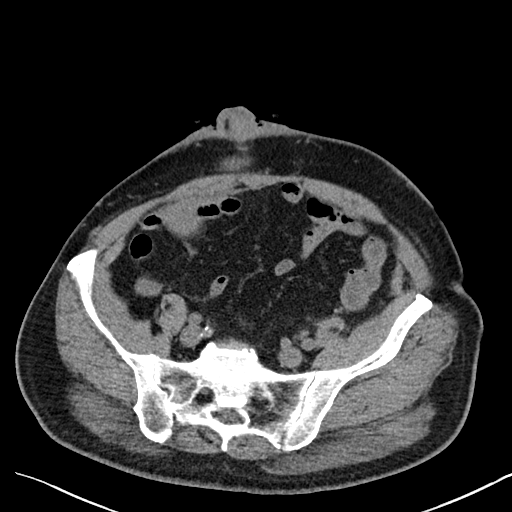
[im 46/103  soft-tissue]
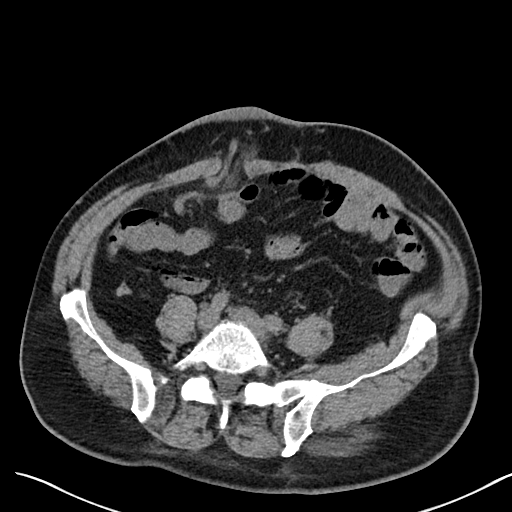
[im 57/103  soft-tissue]
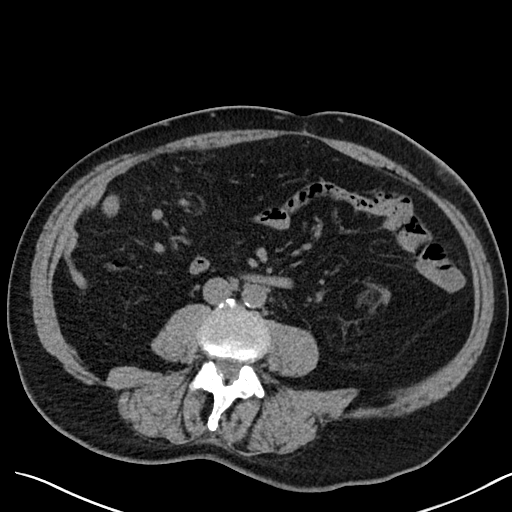
[im 63/103  soft-tissue]
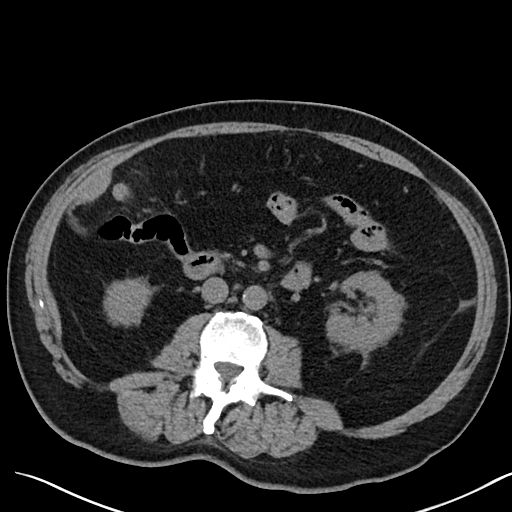
[im 74/103  soft-tissue]
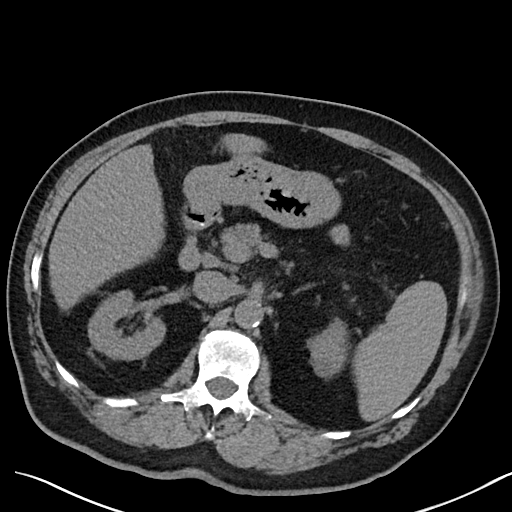
[im 74/103  bone]
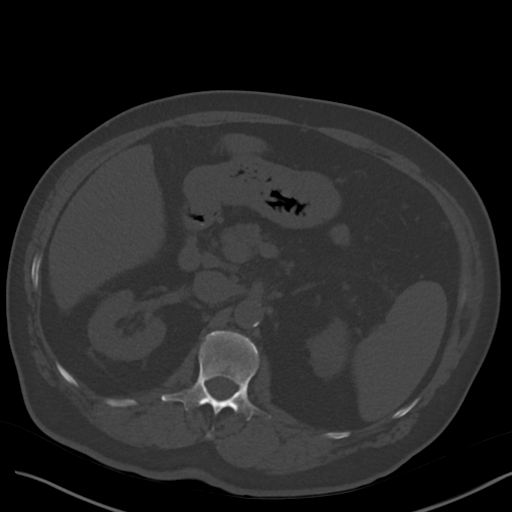
[im 80/103  soft-tissue]
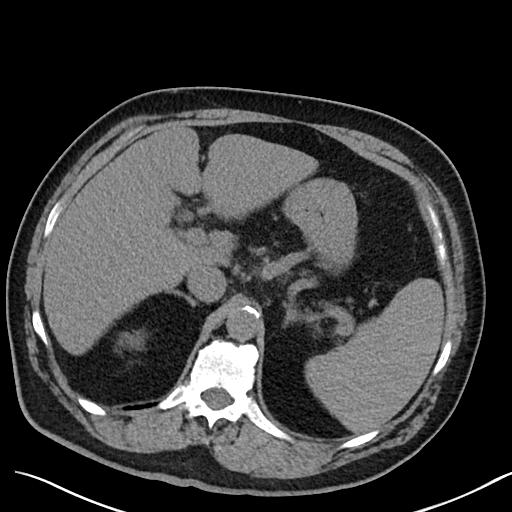
[im 86/103  soft-tissue]
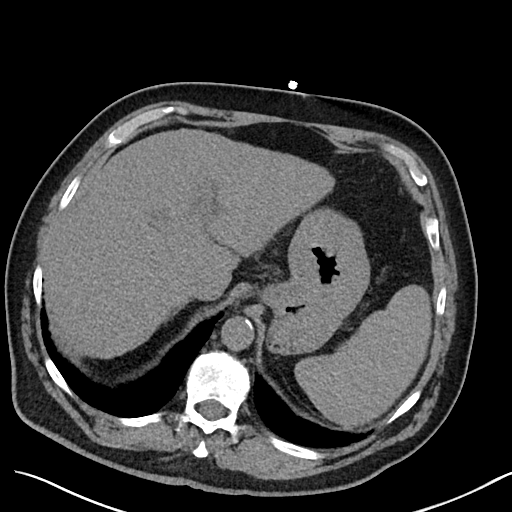
[im 97/103  soft-tissue]
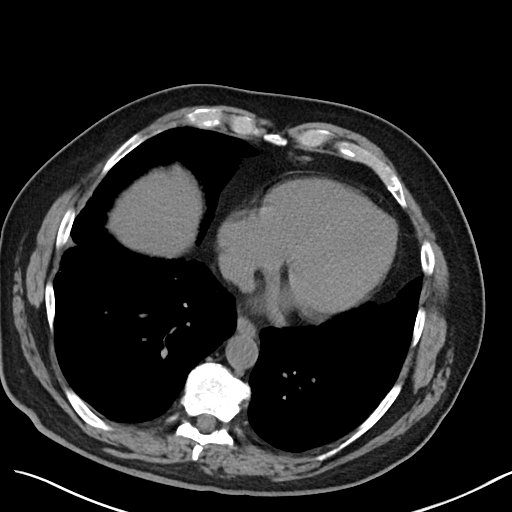

[Series 5: cor · coronal · 0.76mm/px · 3 of 105 slices shown]
[im 35/105  soft-tissue]
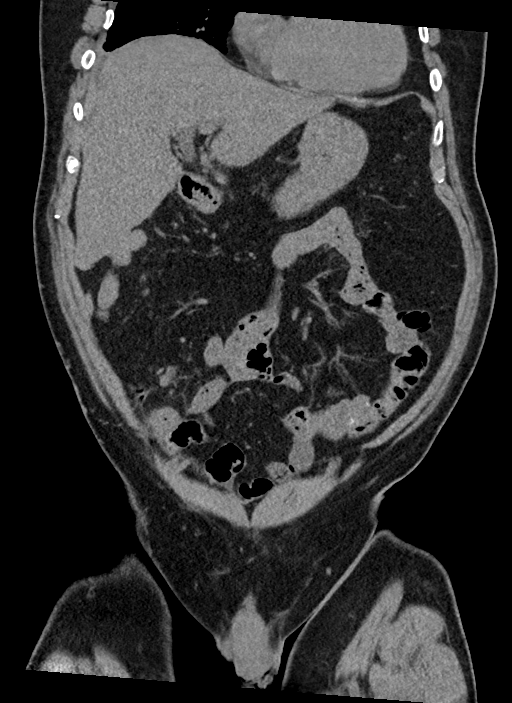
[im 47/105  soft-tissue]
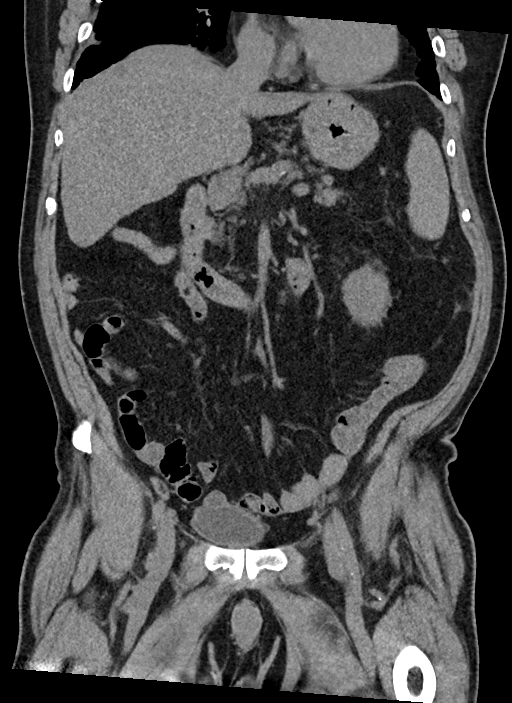
[im 58/105  soft-tissue]
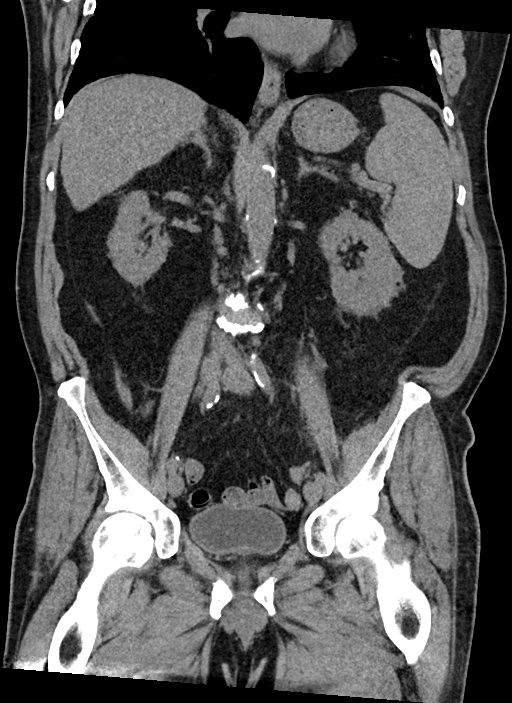

[15 of 46 positions shown; findings below may reference images not displayed]

FINDINGS: Lower chest: Emphysema. Cylindrical bronchiectasis in the right
lower lobe. Mild cardiomegaly. Descending aortic atherosclerotic
calcification.

Hepatobiliary: Nodular liver contour compatible with cirrhosis.
Nonvisualized gallbladder, presumed surgically absent. No biliary
dilatation.

Pancreas: Unremarkable

Spleen: The spleen measures 12.3 by 6.7 by 13.1 cm (volume = 570
cm^3), compatible with splenomegaly.

Adrenals/Urinary Tract: 2 mm left kidney lower pole nonobstructive
renal calculus on image 56 series 5. Fluid density lesion favoring
cyst in the left kidney upper pole the prostate gland indents the
bladder base.

Stomach/Bowel: Wall thickening in the stomach is nonspecific but
probably from nondistention. Colectomy noted with enterostomy in the
right lower quadrant. Dilated portosystemic collateral vessel noted
accompanying the bowel along the ostomy site. There is cutaneous
thickening along the ostomy site such that local cutaneous
inflammation is a distinct possibility. No abnormal gas tracking in
the soft tissues in this region. No abscess observed.

Vascular/Lymphatic: Atherosclerosis is present, including aortoiliac
atherosclerotic disease.

Reproductive: Mild prostatomegaly. Indistinct right spermatic cord,
query right orchectomy.

Other: No supplemental non-categorized findings.

Musculoskeletal: Unremarkable
IMPRESSION: 1. Colectomy with right lower quadrant enterostomy noted. There is
cutaneous thickening around the enterostomy site which could be from
inflammation. Also, there a dilated portosystemic collateral venous
structure which extends through the colostomy site.
2. Other imaging findings of potential clinical significance: Aortic
Atherosclerosis (E7FH3-190.0) and Emphysema (E7FH3-DYO.2). Select
all bronchiectasis in the right lower lobe. Mild cardiomegaly.
Hepatic cirrhosis. Vascular findings of portal venous hypertension.
2 mm left kidney lower pole nonobstructive renal calculus. Mild
prostatomegaly. Indistinct right spermatic cord, query prior right
orchectomy.

/

## 2022-10-19 IMAGING — CT CT ABD-PELV W/O CM
2 of 4 series · 16 of 46 positions shown, 18 images · non-contrast
Comparison: 03/01/2022

CLINICAL DATA: Abdominal pain starting last week, had a blockage
and was evaluated last week, states his dog jumped up on his belly
and has been hurting since, diarrhea, history of colostomy



[Series 2: routine abd/pel wo · axial · 0.77mm/px · z∈[-1085,-610]mm · 13 of 105 slices shown, 15 images]
[im 5/105  soft-tissue]
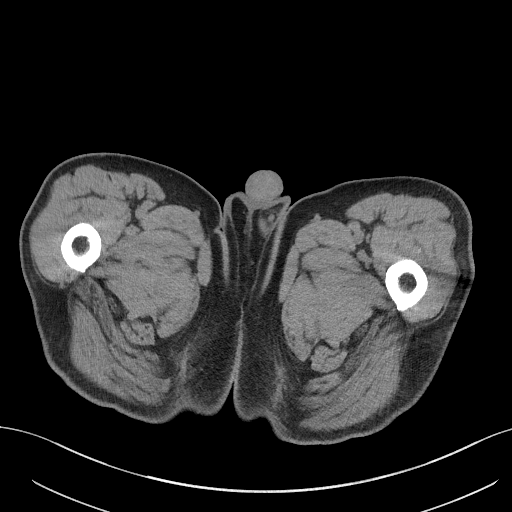
[im 5/105  bone]
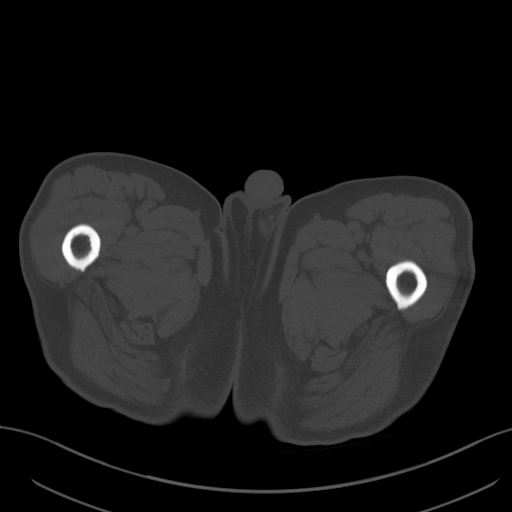
[im 14/105  soft-tissue]
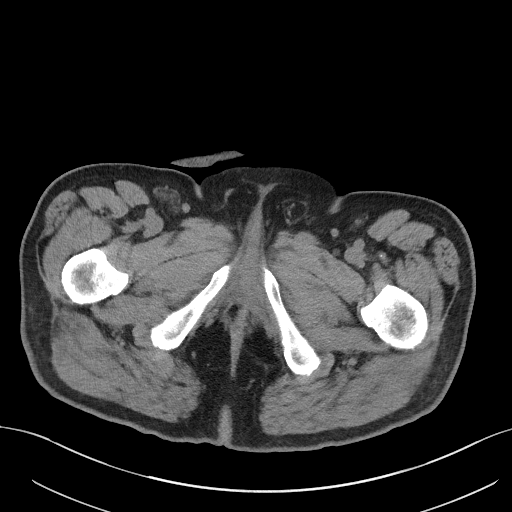
[im 22/105  soft-tissue]
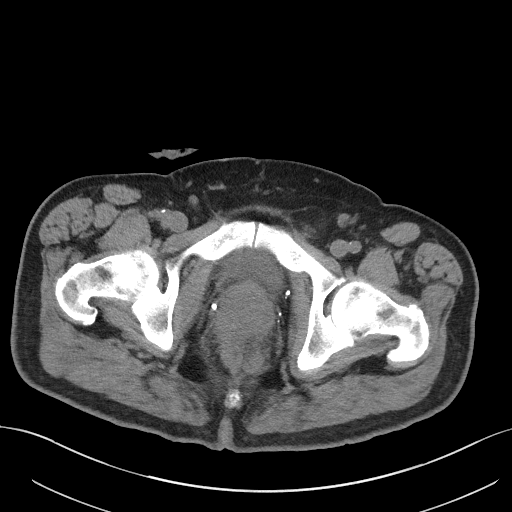
[im 31/105  soft-tissue]
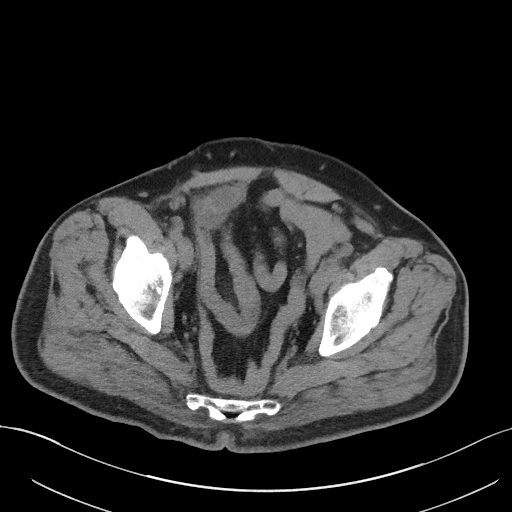
[im 35/105  soft-tissue]
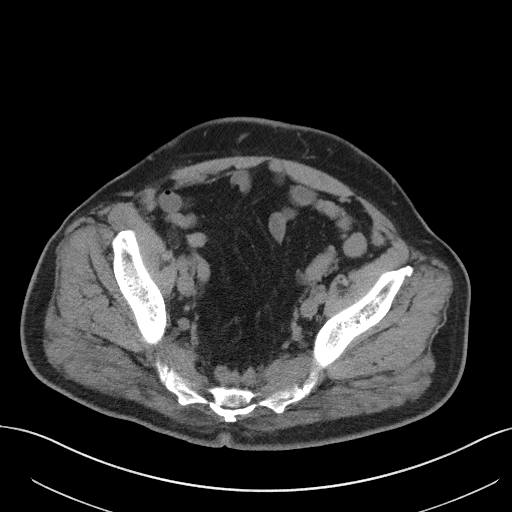
[im 44/105  soft-tissue]
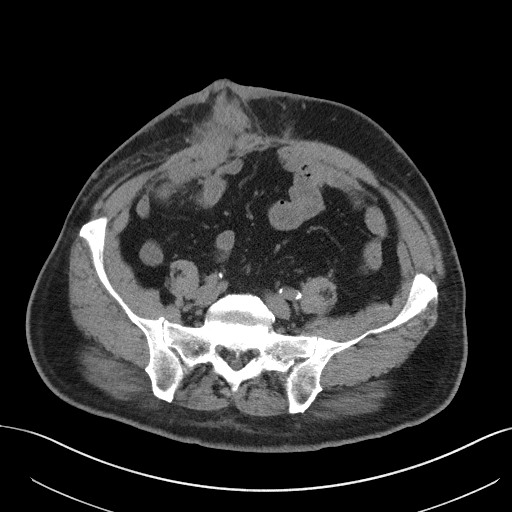
[im 53/105  soft-tissue]
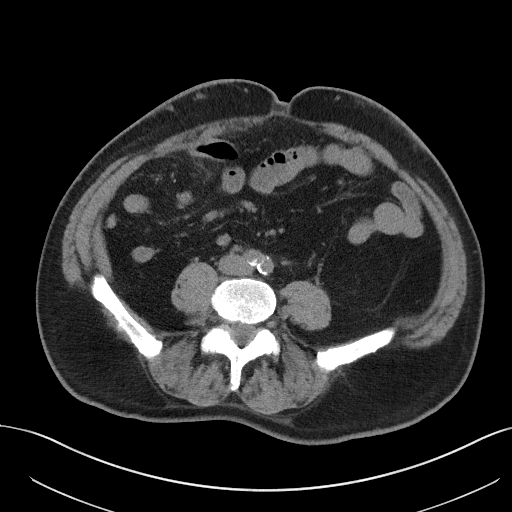
[im 61/105  soft-tissue]
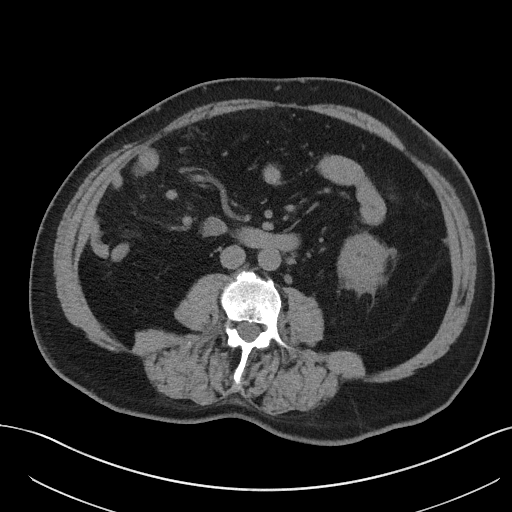
[im 70/105  soft-tissue]
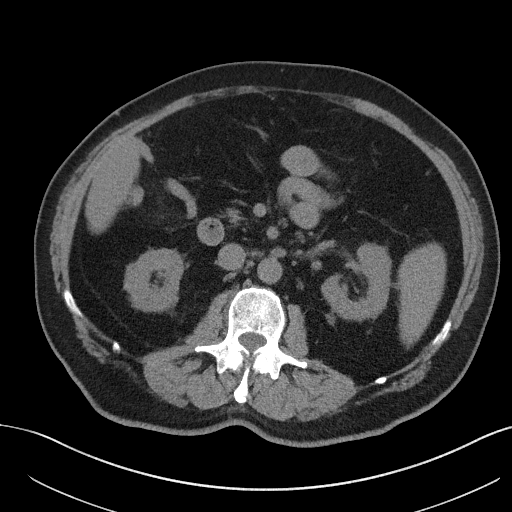
[im 70/105  bone]
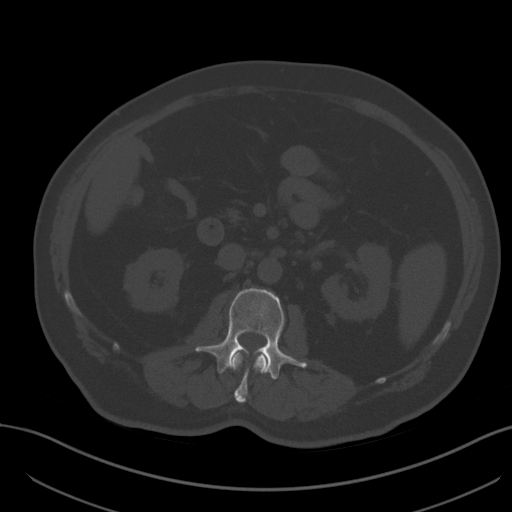
[im 74/105  soft-tissue]
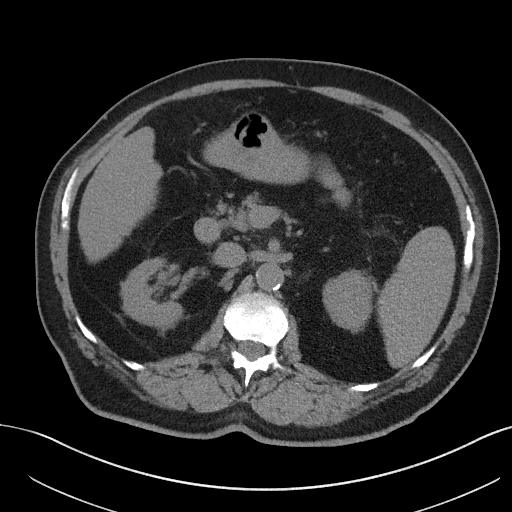
[im 83/105  soft-tissue]
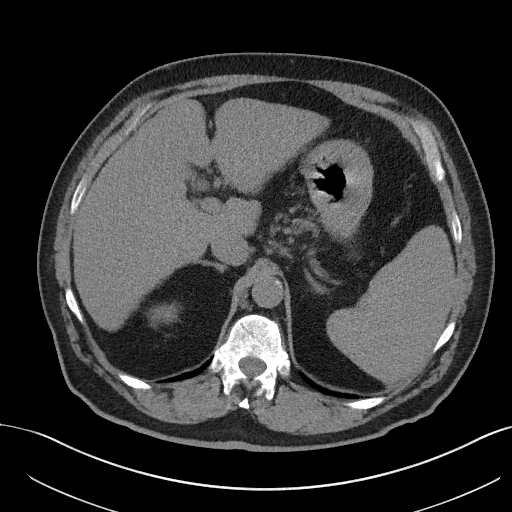
[im 92/105  soft-tissue]
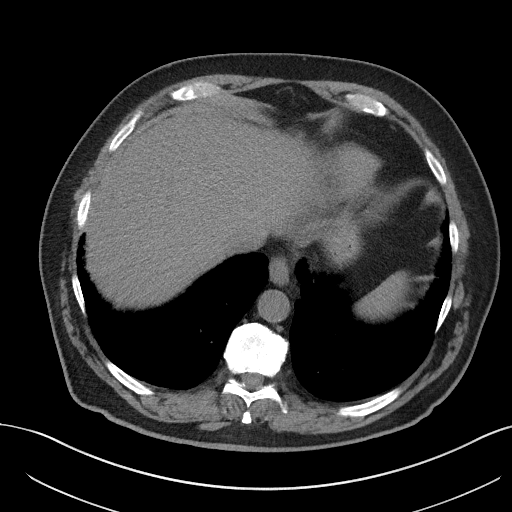
[im 100/105  soft-tissue]
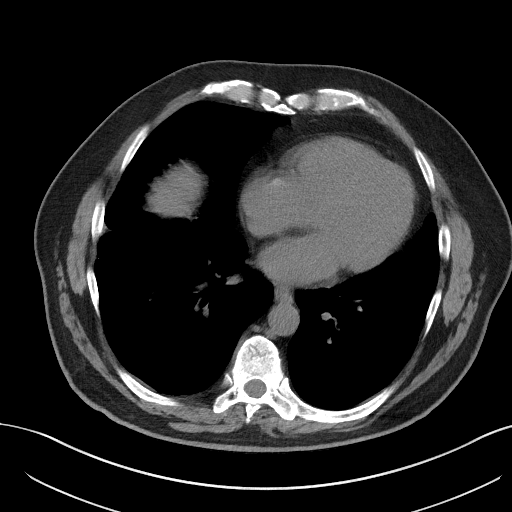

[Series 4: coronal st · coronal · 0.83mm/px · 3 of 106 slices shown]
[im 36/106  soft-tissue]
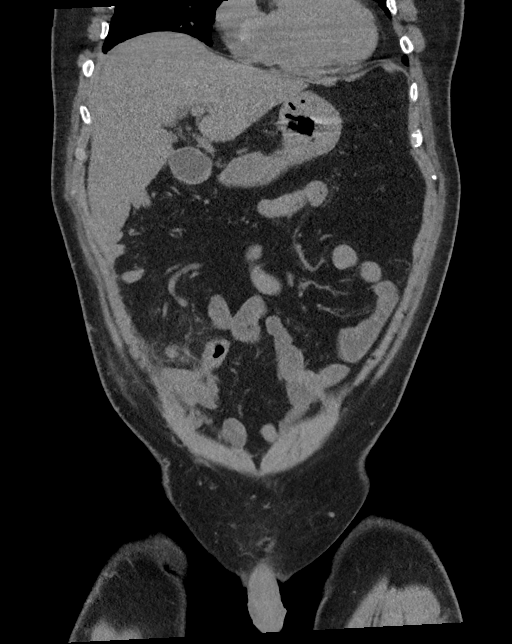
[im 47/106  soft-tissue]
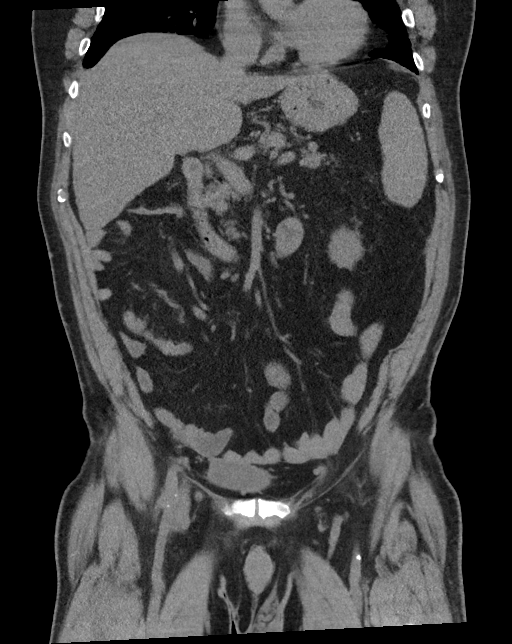
[im 59/106  soft-tissue]
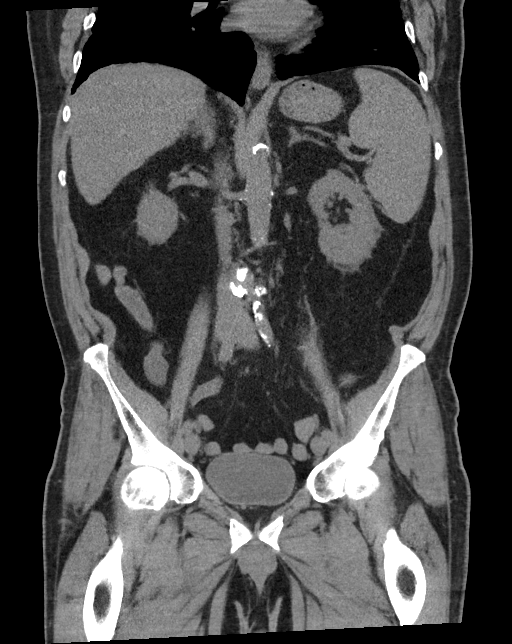

[16 of 46 positions shown; findings below may reference images not displayed]

FINDINGS: Lower chest: Chronic bibasilar emphysematous and interstitial
changes.

Hepatobiliary: Nodular hepatic margins likely reflecting cirrhosis.
Gallbladder surgically absent. No focal hepatic mass.

Pancreas: Atrophic pancreas without mass

Spleen: Prominent in size, 12.3 x 6.7 x 12.7 cm (volume = 550
cm^3). No focal abnormality.

Adrenals/Urinary Tract: Minimal perinephric stranding unchanged.
Tiny inferior pole LEFT renal calculus, nonobstructing. Adrenal
glands, kidneys, ureters, and bladder otherwise unremarkable.

Stomach/Bowel: Ostomy RIGHT mid abdomen with surrounding mild skin
thickening. No bowel dilatation or evidence of obstruction. Stomach
underdistended, limiting assessment of gastric wall thickness.

Vascular/Lymphatic: Atherosclerotic calcifications aorta and iliac
arteries without aneurysm. Minimal coronary arterial calcification.
No adenopathy.

Reproductive: Prostatic enlargement gland measuring 5.3 x 5.1 x
cm (volume = 59 cm^3)

Other: Small umbilical and LEFT inguinal hernias containing fat. No
free air or free fluid.

Musculoskeletal: No acute abnormalities. Mild chronic superior
endplate deformity of L1 unchanged.
IMPRESSION: Cirrhotic appearing liver with splenomegaly.

Small umbilical and LEFT inguinal hernias containing fat.

Prostatic enlargement.

Tiny nonobstructing LEFT renal calculus.

No acute intra-abdominal or intrapelvic abnormalities.

Aortic Atherosclerosis (Z1VIC-1Q9.9).

Emphysema (Z1VIC-FOP.H).

## 2022-10-25 ENCOUNTER — Emergency Department: Payer: Medicare Other

## 2022-10-25 ENCOUNTER — Inpatient Hospital Stay: Payer: Medicare Other

## 2022-10-25 ENCOUNTER — Inpatient Hospital Stay
Admission: EM | Admit: 2022-10-25 | Discharge: 2022-10-28 | DRG: 178 | Disposition: A | Discharge status: Home / Self Care | Payer: Medicare Other | Attending: Internal Medicine | Admitting: Internal Medicine

## 2022-10-25 DIAGNOSIS — N170 Acute kidney failure with tubular necrosis: Secondary | ICD-10-CM | POA: Diagnosis not present

## 2022-10-25 DIAGNOSIS — N401 Enlarged prostate with lower urinary tract symptoms: Secondary | ICD-10-CM | POA: Insufficient documentation

## 2022-10-25 DIAGNOSIS — E1122 Type 2 diabetes mellitus with diabetic chronic kidney disease: Secondary | ICD-10-CM | POA: Diagnosis present

## 2022-10-25 DIAGNOSIS — K5190 Ulcerative colitis, unspecified, without complications: Secondary | ICD-10-CM | POA: Diagnosis present

## 2022-10-25 DIAGNOSIS — K219 Gastro-esophageal reflux disease without esophagitis: Secondary | ICD-10-CM | POA: Diagnosis present

## 2022-10-25 DIAGNOSIS — D72829 Elevated white blood cell count, unspecified: Secondary | ICD-10-CM

## 2022-10-25 DIAGNOSIS — N1831 Chronic kidney disease, stage 3a: Secondary | ICD-10-CM | POA: Diagnosis present

## 2022-10-25 DIAGNOSIS — N179 Acute kidney failure, unspecified: Secondary | ICD-10-CM

## 2022-10-25 DIAGNOSIS — Z79899 Other long term (current) drug therapy: Secondary | ICD-10-CM

## 2022-10-25 DIAGNOSIS — Z87891 Personal history of nicotine dependence: Secondary | ICD-10-CM

## 2022-10-25 DIAGNOSIS — E871 Hypo-osmolality and hyponatremia: Secondary | ICD-10-CM | POA: Diagnosis present

## 2022-10-25 DIAGNOSIS — E875 Hyperkalemia: Secondary | ICD-10-CM | POA: Diagnosis present

## 2022-10-25 DIAGNOSIS — E872 Acidosis, unspecified: Secondary | ICD-10-CM | POA: Diagnosis present

## 2022-10-25 DIAGNOSIS — D6959 Other secondary thrombocytopenia: Secondary | ICD-10-CM | POA: Diagnosis present

## 2022-10-25 DIAGNOSIS — R34 Anuria and oliguria: Secondary | ICD-10-CM

## 2022-10-25 DIAGNOSIS — Z933 Colostomy status: Secondary | ICD-10-CM

## 2022-10-25 DIAGNOSIS — R338 Other retention of urine: Secondary | ICD-10-CM | POA: Insufficient documentation

## 2022-10-25 DIAGNOSIS — Z888 Allergy status to other drugs, medicaments and biological substances status: Secondary | ICD-10-CM

## 2022-10-25 DIAGNOSIS — D509 Iron deficiency anemia, unspecified: Secondary | ICD-10-CM | POA: Diagnosis present

## 2022-10-25 DIAGNOSIS — Z885 Allergy status to narcotic agent status: Secondary | ICD-10-CM | POA: Diagnosis not present

## 2022-10-25 DIAGNOSIS — I130 Hypertensive heart and chronic kidney disease with heart failure and stage 1 through stage 4 chronic kidney disease, or unspecified chronic kidney disease: Secondary | ICD-10-CM | POA: Diagnosis present

## 2022-10-25 DIAGNOSIS — Z794 Long term (current) use of insulin: Secondary | ICD-10-CM

## 2022-10-25 DIAGNOSIS — E861 Hypovolemia: Secondary | ICD-10-CM | POA: Diagnosis present

## 2022-10-25 DIAGNOSIS — E1165 Type 2 diabetes mellitus with hyperglycemia: Secondary | ICD-10-CM | POA: Diagnosis present

## 2022-10-25 DIAGNOSIS — D696 Thrombocytopenia, unspecified: Secondary | ICD-10-CM | POA: Insufficient documentation

## 2022-10-25 DIAGNOSIS — I509 Heart failure, unspecified: Secondary | ICD-10-CM | POA: Diagnosis present

## 2022-10-25 DIAGNOSIS — D631 Anemia in chronic kidney disease: Secondary | ICD-10-CM | POA: Diagnosis present

## 2022-10-25 DIAGNOSIS — Z91041 Radiographic dye allergy status: Secondary | ICD-10-CM

## 2022-10-25 DIAGNOSIS — D849 Immunodeficiency, unspecified: Secondary | ICD-10-CM | POA: Diagnosis present

## 2022-10-25 DIAGNOSIS — U071 COVID-19: Principal | ICD-10-CM

## 2022-10-25 DIAGNOSIS — R0602 Shortness of breath: Secondary | ICD-10-CM | POA: Insufficient documentation

## 2022-10-25 LAB — COMPREHENSIVE METABOLIC PANEL
ALT: 34 U/L (ref 0–44)
AST: 31 U/L (ref 15–41)
Albumin: 4.2 g/dL (ref 3.5–5.0)
Alkaline Phosphatase: 57 U/L (ref 38–126)
Anion gap: 15 (ref 5–15)
BUN: 99 mg/dL — ABNORMAL HIGH (ref 8–23)
CO2: 13 mmol/L — ABNORMAL LOW (ref 22–32)
Calcium: 9.6 mg/dL (ref 8.9–10.3)
Chloride: 105 mmol/L (ref 98–111)
Creatinine, Ser: 8.76 mg/dL — ABNORMAL HIGH (ref 0.61–1.24)
GFR, Estimated: 6 mL/min — ABNORMAL LOW (ref >60)
Glucose, Bld: 199 mg/dL — ABNORMAL HIGH (ref 70–99)
Potassium: 6.9 mmol/L (ref 3.5–5.1)
Sodium: 133 mmol/L — ABNORMAL LOW (ref 135–145)
Total Bilirubin: 1.2 mg/dL (ref 0.3–1.2)
Total Protein: 8.8 g/dL — ABNORMAL HIGH (ref 6.5–8.1)

## 2022-10-25 LAB — CBC
HCT: 46.8 % (ref 39.0–52.0)
Hemoglobin: 15.2 g/dL (ref 13.0–17.0)
MCH: 31.1 pg (ref 26.0–34.0)
MCHC: 32.5 g/dL (ref 30.0–36.0)
MCV: 95.7 fL (ref 80.0–100.0)
Platelets: 362 10*3/uL (ref 150–400)
RBC: 4.89 MIL/uL (ref 4.22–5.81)
RDW: 13.1 % (ref 11.5–15.5)
WBC: 13.9 10*3/uL — ABNORMAL HIGH (ref 4.0–10.5)
nRBC: 0 % (ref 0.0–0.2)

## 2022-10-25 LAB — MAGNESIUM: Magnesium: 2.1 mg/dL (ref 1.7–2.4)

## 2022-10-25 LAB — BLOOD GAS, VENOUS
Acid-base deficit: 16.9 mmol/L — ABNORMAL HIGH (ref 0.0–2.0)
Bicarbonate: 11.8 mmol/L — ABNORMAL LOW (ref 20.0–28.0)
O2 Saturation: 35.4 %
Patient temperature: 37
pCO2, Ven: 37 mmHg — ABNORMAL LOW (ref 44–60)
pH, Ven: 7.11 — CL (ref 7.25–7.43)
pO2, Ven: <31 mmHg — CL (ref 32–45)

## 2022-10-25 LAB — APTT: aPTT: 36 seconds (ref 24–36)

## 2022-10-25 LAB — PROTIME-INR
INR: 1.4 — ABNORMAL HIGH (ref 0.8–1.2)
Prothrombin Time: 17.4 seconds — ABNORMAL HIGH (ref 11.4–15.2)

## 2022-10-25 LAB — TROPONIN I (HIGH SENSITIVITY): Troponin I (High Sensitivity): 16 ng/L (ref <18)

## 2022-10-25 MED ORDER — SODIUM BICARBONATE 8.4 % IV SOLN
50.0000 meq | Freq: Once | INTRAVENOUS | Status: AC
  Administered 2022-10-25: 50 meq via INTRAVENOUS
  Filled 2022-10-25: qty 50

## 2022-10-25 MED ORDER — SODIUM CHLORIDE 0.9 % IV BOLUS
1000.0000 mL | Freq: Once | INTRAVENOUS | Status: AC
  Administered 2022-10-25: 1000 mL via INTRAVENOUS

## 2022-10-25 MED ORDER — PANTOPRAZOLE SODIUM 40 MG PO TBEC
40.0000 mg | DELAYED_RELEASE_TABLET | Freq: Every day | ORAL | Status: DC
  Administered 2022-10-26 – 2022-10-28 (×3): 40 mg via ORAL
  Filled 2022-10-25 (×3): qty 1

## 2022-10-25 MED ORDER — ACETAMINOPHEN 650 MG RE SUPP: 650.0000 mg | Freq: Four times a day (QID) | RECTAL | Status: DC | PRN

## 2022-10-25 MED ORDER — CALCIUM GLUCONATE-NACL 1-0.675 GM/50ML-% IV SOLN
1.0000 g | Freq: Once | INTRAVENOUS | Status: AC
  Administered 2022-10-25: 1000 mg via INTRAVENOUS
  Filled 2022-10-25: qty 50

## 2022-10-25 MED ORDER — ACETAMINOPHEN 325 MG PO TABS
650.0000 mg | ORAL_TABLET | Freq: Four times a day (QID) | ORAL | Status: DC | PRN
  Administered 2022-10-27: 650 mg via ORAL
  Filled 2022-10-25: qty 2

## 2022-10-25 MED ORDER — SODIUM ZIRCONIUM CYCLOSILICATE 10 G PO PACK
10.0000 g | PACK | Freq: Once | ORAL | Status: AC
  Administered 2022-10-25: 10 g via ORAL
  Filled 2022-10-25: qty 1

## 2022-10-25 MED ORDER — SODIUM CHLORIDE 0.9 % IV SOLN
200.0000 mg | Freq: Once | INTRAVENOUS | Status: AC
  Administered 2022-10-26: 200 mg via INTRAVENOUS
  Filled 2022-10-25: qty 40

## 2022-10-25 MED ORDER — INSULIN ASPART 100 UNIT/ML IJ SOLN
6.0000 [IU] | Freq: Once | INTRAMUSCULAR | Status: AC
  Administered 2022-10-25: 6 [IU] via INTRAVENOUS
  Filled 2022-10-25: qty 1

## 2022-10-25 MED ORDER — SODIUM BICARBONATE 8.4 % IV SOLN
INTRAVENOUS | Status: DC
  Filled 2022-10-25 (×3): qty 1000
  Filled 2022-10-25: qty 150

## 2022-10-25 MED ORDER — POLYETHYLENE GLYCOL 3350 17 G PO PACK: 17.0000 g | PACK | Freq: Every day | ORAL | Status: DC | PRN

## 2022-10-25 MED ORDER — HEPARIN SODIUM (PORCINE) 5000 UNIT/ML IJ SOLN
5000.0000 [IU] | Freq: Three times a day (TID) | INTRAMUSCULAR | Status: DC
  Administered 2022-10-25 – 2022-10-28 (×9): 5000 [IU] via SUBCUTANEOUS
  Filled 2022-10-25 (×8): qty 1

## 2022-10-25 MED ORDER — SODIUM CHLORIDE 0.9% FLUSH
3.0000 mL | Freq: Two times a day (BID) | INTRAVENOUS | Status: DC
  Administered 2022-10-26 (×2): 3 mL via INTRAVENOUS

## 2022-10-25 MED ORDER — SODIUM BICARBONATE 8.4 % IV SOLN
50.0000 meq | Freq: Once | INTRAVENOUS | Status: AC
  Administered 2022-10-25: 50 meq via INTRAVENOUS

## 2022-10-25 MED ORDER — SODIUM CHLORIDE 0.9 % IV SOLN
2.0000 g | INTRAVENOUS | Status: DC
  Administered 2022-10-25 – 2022-10-26 (×2): 2 g via INTRAVENOUS
  Filled 2022-10-25 (×2): qty 20

## 2022-10-25 MED ORDER — SODIUM CHLORIDE 0.9 % IV SOLN
500.0000 mg | INTRAVENOUS | Status: DC
  Administered 2022-10-26 (×2): 500 mg via INTRAVENOUS
  Filled 2022-10-25 (×2): qty 5

## 2022-10-25 MED ORDER — ALBUTEROL SULFATE (2.5 MG/3ML) 0.083% IN NEBU
2.5000 mg | INHALATION_SOLUTION | RESPIRATORY_TRACT | Status: AC
  Administered 2022-10-25 – 2022-10-26 (×5): 2.5 mg via RESPIRATORY_TRACT
  Filled 2022-10-25 (×4): qty 3

## 2022-10-25 MED ORDER — SODIUM ZIRCONIUM CYCLOSILICATE 10 G PO PACK
10.0000 g | PACK | Freq: Every day | ORAL | Status: DC
  Administered 2022-10-25: 10 g via ORAL
  Filled 2022-10-25 (×2): qty 1

## 2022-10-25 MED ORDER — DEXTROSE 50 % IV SOLN
1.0000 | Freq: Once | INTRAVENOUS | Status: AC
  Administered 2022-10-25: 50 mL via INTRAVENOUS
  Filled 2022-10-25: qty 50

## 2022-10-25 MED ORDER — SODIUM BICARBONATE 8.4 % IV SOLN
50.0000 meq | Freq: Once | INTRAVENOUS | Status: AC
  Administered 2022-10-25: 50 meq via INTRAVENOUS
  Filled 2022-10-25: qty 100

## 2022-10-25 MED ORDER — SODIUM CHLORIDE 0.9 % IV SOLN
100.0000 mg | Freq: Every day | INTRAVENOUS | Status: DC
  Administered 2022-10-26: 100 mg via INTRAVENOUS
  Filled 2022-10-25 (×2): qty 20

## 2022-10-25 MED ORDER — INSULIN ASPART 100 UNIT/ML IJ SOLN
0.0000 [IU] | Freq: Every day | INTRAMUSCULAR | Status: DC
  Administered 2022-10-25: 4 [IU] via SUBCUTANEOUS
  Administered 2022-10-26 – 2022-10-27 (×2): 3 [IU] via SUBCUTANEOUS
  Filled 2022-10-25 (×4): qty 1

## 2022-10-25 MED ORDER — INSULIN ASPART 100 UNIT/ML IJ SOLN
0.0000 [IU] | Freq: Three times a day (TID) | INTRAMUSCULAR | Status: DC
  Administered 2022-10-26 (×2): 7 [IU] via SUBCUTANEOUS
  Administered 2022-10-26: 3 [IU] via SUBCUTANEOUS
  Administered 2022-10-27: 5 [IU] via SUBCUTANEOUS
  Administered 2022-10-27: 2 [IU] via SUBCUTANEOUS
  Administered 2022-10-27: 3 [IU] via SUBCUTANEOUS
  Administered 2022-10-28: 5 [IU] via SUBCUTANEOUS
  Administered 2022-10-28: 2 [IU] via SUBCUTANEOUS
  Filled 2022-10-25 (×8): qty 1

## 2022-10-25 NOTE — ED Triage Notes (Signed)
Pt sts that he was instructed by his PCP to get to the ED due to his A1c being very high. Pt also was dx with COVID yesterday.

## 2022-10-25 NOTE — Assessment & Plan Note (Signed)
Given immunocompromise, and chest x-ray finding, I will draw blood cultures and treat the patient with Unasyn.

## 2022-10-25 NOTE — ED Provider Notes (Signed)
[facility] Provider Note    Event Date/Time   First MD Initiated Contact with Patient 10/25/22 1908     (approximate)   History   Allergic Reaction and Abnormal Labs   HPI  Alexander Cunningham is a 72 y.o. male with a reported history of stage III CKD presents to the ER due to concern for elevated potassium from outpatient clinic today.  Patient just recently tested positive for COVID has had poor p.o. intake.  Noticed over the past 36 to 48 hours he has not really made much urine.  He does feel weak and tired.  He denies any vomiting.  Denies any chest pain.     Physical Exam   Triage Vital Signs: ED Triage Vitals  Enc Vitals Group     BP 10/25/22 1842 115/64     Pulse --      Resp 10/25/22 1842 17     Temp 10/25/22 1842 97.6 F (36.4 C)     Temp Source 10/25/22 1842 Oral     SpO2 10/25/22 1842 98 %     Weight 10/25/22 1845 172 lb (78 kg)     Height --      Head Circumference --      Peak Flow --      Pain Score 10/25/22 1845 0     Pain Loc --      Pain Edu? --      Excl. in GC? --     Most recent vital signs: Vitals:   10/25/22 1842  BP: 115/64  Resp: 17  Temp: 97.6 F (36.4 C)  SpO2: 98%     Constitutional: Alert  Eyes: Conjunctivae are normal.  Head: Atraumatic. Nose: No congestion/rhinnorhea. Mouth/Throat: Mucous membranes are dry   Neck: Painless ROM.  Cardiovascular:   Good peripheral circulation. Respiratory: Normal respiratory effort.  No retractions.  Gastrointestinal: Soft and nontender.  Musculoskeletal:  no deformity Neurologic:  MAE spontaneously. No gross focal neurologic deficits are appreciated.  Skin:  Skin is warm, dry and intact. No rash noted. Psychiatric: Mood and affect are normal. Speech and behavior are normal.    ED Results / Procedures / Treatments   Labs (all labs ordered are listed, but only abnormal results are displayed) Labs Reviewed  CBC - Abnormal; Notable for the following components:       Result Value   WBC 13.9 (*)    All other components within normal limits  COMPREHENSIVE METABOLIC PANEL - Abnormal; Notable for the following components:   Sodium 133 (*)    Potassium 6.9 (*)    CO2 13 (*)    Glucose, Bld 199 (*)    Creatinine, Ser 8.76 (*)    Total Protein 8.8 (*)    GFR, Estimated 6 (*)    All other components within normal limits  BLOOD GAS, VENOUS - Abnormal; Notable for the following components:   pH, Ven 7.11 (*)    pCO2, Ven 37 (*)    pO2, Ven <31 (*)    Bicarbonate 11.8 (*)    Acid-base deficit 16.9 (*)    All other components within normal limits  TROPONIN I (HIGH SENSITIVITY)  TROPONIN I (HIGH SENSITIVITY)     EKG  ED ECG REPORT I, Willy Eddy, the attending physician, personally viewed and interpreted this ECG.   Date: 10/25/2022  EKG Time: 18:49  Rate: 100  Rhythm: sinus  Axis: normal  Intervals: normal  ST&T Change: peaked t waves  RADIOLOGY Please see ED Course for my review and interpretation.  I personally reviewed all radiographic images ordered to evaluate for the above acute complaints and reviewed radiology reports and findings.  These findings were personally discussed with the patient.  Please see medical record for radiology report.    PROCEDURES:  Critical Care performed: Yes, see critical care procedure note(s)  .Critical Care  Performed by: Merlyn Lot, MD Authorized by: Merlyn Lot, MD   Critical care provider statement:    Critical care time (minutes):  45   Critical care was necessary to treat or prevent imminent or life-threatening deterioration of the following conditions:  Renal failure   Critical care was time spent personally by me on the following activities:  Ordering and performing treatments and interventions, ordering and review of laboratory studies, ordering and review of radiographic studies, pulse oximetry, re-evaluation of patient's condition, review of old charts, obtaining  history from patient or surrogate, examination of patient, evaluation of patient's response to treatment, discussions with primary provider, discussions with consultants and development of treatment plan with patient or surrogate    MEDICATIONS ORDERED IN ED: Medications  sodium zirconium cyclosilicate (LOKELMA) packet 10 g (has no administration in time range)  sodium bicarbonate 150 mEq in dextrose 5 % 1,150 mL infusion (has no administration in time range)  sodium bicarbonate injection 50 mEq (has no administration in time range)  calcium gluconate 1 g/ 50 mL sodium chloride IVPB (0 mg Intravenous Stopped 10/25/22 2036)  sodium chloride 0.9 % bolus 1,000 mL (1,000 mLs Intravenous New Bag/Given 10/25/22 1926)  insulin aspart (novoLOG) injection 6 Units (6 Units Intravenous Given 10/25/22 2035)  sodium bicarbonate injection 50 mEq (50 mEq Intravenous Given 10/25/22 2033)  sodium bicarbonate injection 50 mEq (50 mEq Intravenous Given 10/25/22 2032)  dextrose 50 % solution 50 mL (50 mLs Intravenous Given 10/25/22 2036)     IMPRESSION / MDM / [place] / ED COURSE  I reviewed the triage vital signs and the nursing notes.                              Differential diagnosis includes, but is not limited to, slight abnormality, AKI, sepsis, anemia, medication effect, COVID  Patient presenting to the ER for evaluation of symptoms as described above.  Based on symptoms, risk factors and considered above differential, this presenting complaint could reflect a potentially life-threatening illness therefore the patient will be placed on continuous pulse oximetry and telemetry for monitoring.  Laboratory evaluation will be sent to evaluate for the above complaints.  Will give IV fluids.  Will check EKG.   Clinical Course as of 10/25/22 2045  Thu Oct 25, 2022  5284 I have ordered calcium as I am concerned that his potassium is significantly elevated in setting of acute renal failure.  His EKG  does show peaked T waves.  I have ordered IV fluids.  His VBG does show evidence of decreased bicarb and metabolic acidosis. [PR]  2019 Patient's metabolic panel with hyperkalemia potassium of 6.9.  Significant acute kidney injury with creatinine of 8 GFR less than 6.  Troponin negative.  Chest x-ray on my review and interpretation without evidence of acute infiltrate.  Have ordered in addition to IV calcium and fluids insulin as well as bicarb.  I will consult nephrology. [PR]  2027 Discussed case in consultation with nephrology as recommended 3 A of bicarb, Lokelma in addition to what is  already been ordered as well as initiation of bicarb infusion.  Will consult hospitalist. [PR]    Clinical Course User Index [PR] Merlyn Lot, MD    FINAL CLINICAL IMPRESSION(S) / ED DIAGNOSES   Final diagnoses:  Acute renal failure, unspecified acute renal failure type ([place])  Acute hyperkalemia  COVID-19     Rx / DC Orders   ED Discharge Orders     None        Note:  This document was prepared using Dragon voice recognition software and may include unintentional dictation errors.    Merlyn Lot, MD 10/25/22 2045

## 2022-10-25 NOTE — Assessment & Plan Note (Signed)
Patient has received calcium, infusion as well as glucose and insulin infusion in the ER, patient is also receiving Lokelma.  I will order additional [place] for midnight.  Albuterol nebulizations.  Patient is being started on sodium bicarbonate infusion

## 2022-10-25 NOTE — Assessment & Plan Note (Signed)
Given the degree of increase in sore creatinine as well as BUN I believe this is not acute process of the last 48 hours alone.  At this time we will check urinalysis sodium and creatinine.  I have requested a bladder scan in the ER and plan to do formal renal ultrasound.  Place Foley.  Agree with boluses of saline in ER with presumptive diagnosis of prerenal AKI.  Will monitor urine output

## 2022-10-25 NOTE — Assessment & Plan Note (Signed)
I do not see an indication for starting the patient on Decadron.  Given that he is not hypoxic.  Patient is immunocompromised and I think we can start remdesivir

## 2022-10-25 NOTE — H&P (Signed)
History and Physical    PatientDaviyon Cunningham GBT:[id number] DOB: Oct 07, 1950 DOA: 10/25/2022 DOS: the patient was seen and examined on 10/25/2022 PCP: Juluis Pitch, MD  Patient coming from: Home  Chief Complaint:  Chief Complaint  Patient presents with   Allergic Reaction   Abnormal Labs   HPI: Alexander Cunningham is a 72 y.o. male reports being in his usual state of health till about 2 days ago.  Before that patient reports he had no trouble getting up walking around eating drinking etc.  No shortness of breath.  For the last 2 days patient reports new onset of shortness of breath that is present pretty much all the time and also worse with ambulation.  Patient denies any fever chest pain presyncope palpitation or falling.  No leg swelling or cramping.  Patient also reports 2 days of nausea and very poor appetite, especially in the last 24 hours.  Patient also reports not making urine and then in the last 24 hours.  Patient reports no prior trouble having urination.  Patient denies any vomiting abdominal pain or diarrhea.  Patient was actually seen by PCP yesterday I believe for above symptoms, patient had a COVID test done which was positive, also found to have acute kidney injury and hyperkalemia and was referred to the ER tonight.  Patient in the ER has received treatment as noted below, reports feeling okay.  No offer no new complaints Review of Systems: As mentioned in the history of present illness. All other systems reviewed and are negative. Past Medical History:  Diagnosis Date   CHF (congestive heart failure) (HCC)    Diabetes mellitus without complication (HCC)    GERD (gastroesophageal reflux disease)    Ulcerative colitis ([place])    Past Surgical History:  Procedure Laterality Date   COLON SURGERY     ESOPHAGOGASTRODUODENOSCOPY N/A 08/17/2021   Procedure: ESOPHAGOGASTRODUODENOSCOPY (EGD);  Surgeon: Annamaria Helling, DO;  Location: [facility] ENDOSCOPY;  Service:  Gastroenterology;  Laterality: N/A;  IDDM   ESOPHAGOGASTRODUODENOSCOPY N/A 08/30/2022   Procedure: ESOPHAGOGASTRODUODENOSCOPY (EGD);  Surgeon: Annamaria Helling, DO;  Location[facility] ENDOSCOPY;  Service: Gastroenterology;  Laterality: N/A;   HERNIA REPAIR     Social History:  reports that he has quit smoking. His smoking use included cigarettes. He has never used smokeless tobacco. He reports current alcohol use. He reports that he does not use drugs.  Allergies  Allergen Reactions   Contrast Media [Iodinated Contrast Media] Rash   Other Rash    Pt states the bed sheets at the hospital gave him a bad purple rash on his back.   Oxycodone Hives   Tricor [Fenofibrate] Rash  Patient has documented chronic kidney disease on the chart.  For under a year  No family history on file.  Prior to Admission medications   Medication Sig Start Date End Date Taking? Authorizing Provider  ferrous sulfate 325 (65 FE) MG tablet Take 325 mg by mouth every morning. 01/04/21   [provider]  gabapentin (NEURONTIN) 300 MG capsule Take 300 mg by mouth 3 (three) times daily. 05/16/21   [provider]  insulin aspart (NOVOLOG) 100 UNIT/ML FlexPen Inject 15 Units into the skin in the morning, at noon, and at bedtime.    [provider]  Insulin Glargine (BASAGLAR KWIKPEN) 100 UNIT/ML Inject 40-50 Units into the skin See admin instructions. Inject 50u under the skin every morning and inject 40u under the skin every night    [provider]  lisinopril (  ZESTRIL) 5 MG tablet Take 5 mg by mouth every other day. 04/17/21   [provider]  magnesium oxide (MAG-OX) 400 MG tablet Take 1 tablet by mouth 2 (two) times daily. 05/03/21   [provider]  omeprazole (PRILOSEC) 40 MG capsule Take 40 mg by mouth daily. 05/26/21   [provider]  propranolol (INDERAL) 10 MG tablet Take 10 mg by mouth 2 (two) times daily. 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  Patient reports tenderness around the site which is in the suprapubic area.  Deep palpation was not possible due to patient discomfort.  However there is no guarding or rebound in other quadrants. Extremities warm no edema Neurologically patient is alert awake give coherent history by himself seems to be hard of hearing however no focal motor deficit. Data Reviewed: Results for orders placed or performed during the hospital encounter of 10/25/22 (from the past 24 hour(s))  CBC     Status: Abnormal   Collection Time: 10/25/22  6:55 PM  Result Value Ref Range   WBC 13.9 (H) 4.0 - 10.5 K/uL   RBC 4.89 4.22 - 5.81 MIL/uL   Hemoglobin 15.2 13.0 - 17.0 g/dL   HCT 46.8 39.0 - 52.0 %   MCV 95.7 80.0 - 100.0 fL   MCH 31.1 26.0 - 34.0 pg   MCHC 32.5 30.0 - 36.0 g/dL   RDW 13.1 11.5 - 15.5 %   Platelets 362 150 - 400 K/uL   nRBC 0.0 0.0 - 0.2 %  Comprehensive metabolic panel     Status: Abnormal   Collection Time: 10/25/22  6:55 PM  Result Value Ref Range   Sodium 133 (L) 135 - 145 mmol/L   Potassium 6.9 (HH) 3.5 - 5.1 mmol/L   Chloride 105 98 - 111 mmol/L   CO2 13 (L) 22 - 32 mmol/L   Glucose, Bld 199 (H) 70 - 99 mg/dL   BUN 99 (H) 8 - 23 mg/dL   Creatinine, Ser 8.76 (H) 0.61 - 1.24 mg/dL   Calcium 9.6 8.9 - 10.3 mg/dL   Total Protein 8.8 (H) 6.5 - 8.1 g/dL   Albumin 4.2 3.5 - 5.0 g/dL   AST 31 15 - 41 U/L   ALT 34 0 - 44 U/L   Alkaline Phosphatase 57 38 - 126 U/L   Total Bilirubin 1.2 0.3 - 1.2 mg/dL   GFR, Estimated 6 (L) >60 mL/min   Anion gap 15 5 - 15  Troponin I (High Sensitivity)     Status: None    Collection Time: 10/25/22  6:55 PM  Result Value Ref Range   Troponin I (High Sensitivity) 16 <18 ng/L  Blood gas, venous     Status: Abnormal   Collection Time: 10/25/22  6:59 PM  Result Value Ref Range   pH, Ven 7.11 (LL) 7.25 - 7.43   pCO2, Ven 37 (L) 44 - 60 mmHg   pO2, Ven <31 (LL) 32 - 45 mmHg   Bicarbonate 11.8 (L)  20.0 - 28.0 mmol/L   Acid-base deficit 16.9 (H) 0.0 - 2.0 mmol/L   O2 Saturation 35.4 %   Patient temperature 37.0    Collection site VENOUS    EKG - sinus tachy    SARS-CoV2 PCR Negative Positive Abnormal    CXR image reviewed. IMPRESSION: No active disease. Chronic appearing interstitial opacities in the left lower lung. Assessment and Plan: * Hyperkalemia Patient has received calcium, infusion as well as glucose and insulin infusion in the ER, patient is also receiving Lokelma.  I will order additional Lokelma for midnight.  Albuterol nebulizations.  Patient is being started on sodium bicarbonate infusion  Acidosis Catabolic, continue with bicarb infusion as ordered, trend  Leukocytosis Given immunocompromise, and chest x-ray finding, I will draw blood cultures and treat the patient with Unasyn.  COVID-19 virus infection  I do not see an indication for starting the patient on Decadron.  Given that he is not hypoxic.  Patient is immunocompromised and I think we can start remdesivir  Shortness of breath I have reviewed the chest x-ray and the entire patient presentation is likely not related to COVID-19 disease.  It is likely related to patient acidosis causing tachypnea  AKI (acute kidney injury) (HCC) Given the degree of increase in sore creatinine as well as BUN I believe this is not acute process of the last 48 hours alone.  At this time we will check urinalysis sodium and creatinine.  I have requested a bladder scan in the ER and plan to do formal renal ultrasound.  Place Foley.  Agree with boluses of saline in ER with presumptive diagnosis of prerenal  AKI.  Will monitor urine output      Advance Care Planning:   Code Status: Prior full code  Consults: Nephrology was consulted by the ER attending.  Kindly follow-up notes from Dr. Lourdes Sledge  Family Communication: I attempted to reach wife on the phone number listed.  Unfortunately it seems to be fairly late in the evening for the wife and she did not take the phone call I did not leave a voicemail at this time  Severity of Illness: The appropriate patient status for this patient is INPATIENT. Inpatient status is judged to be reasonable and necessary in order to provide the required intensity of service to ensure the patient's safety. The patient's presenting symptoms, physical exam findings, and initial radiographic and laboratory data in the context of their chronic comorbidities is felt to place them at high risk for further clinical deterioration. Furthermore, it is not anticipated that the patient will be medically stable for discharge from the hospital within 2 midnights of admission.   * I certify that at the point of admission it is my clinical judgment that the patient will require inpatient hospital care spanning beyond 2 midnights from the point of admission due to high intensity of service, high risk for further deterioration and high frequency of surveillance required.*  Author: Nolberto Hanlon, MD 10/25/2022 9:02 PM  For on call review [email or web address].

## 2022-10-25 NOTE — Assessment & Plan Note (Addendum)
I have reviewed the chest x-ray and the entire patient presentation is likely not related to COVID-19 disease.  It is likely related to patient acidosis causing tachypnea

## 2022-10-25 NOTE — Assessment & Plan Note (Signed)
Catabolic, continue with bicarb infusion as ordered, trend

## 2022-10-26 ENCOUNTER — Inpatient Hospital Stay: Payer: Medicare Other

## 2022-10-26 ENCOUNTER — Other Ambulatory Visit: Payer: Self-pay

## 2022-10-26 DIAGNOSIS — E875 Hyperkalemia: Secondary | ICD-10-CM

## 2022-10-26 DIAGNOSIS — R338 Other retention of urine: Secondary | ICD-10-CM | POA: Insufficient documentation

## 2022-10-26 DIAGNOSIS — N170 Acute kidney failure with tubular necrosis: Secondary | ICD-10-CM

## 2022-10-26 DIAGNOSIS — N1831 Chronic kidney disease, stage 3a: Secondary | ICD-10-CM

## 2022-10-26 DIAGNOSIS — N401 Enlarged prostate with lower urinary tract symptoms: Secondary | ICD-10-CM | POA: Insufficient documentation

## 2022-10-26 DIAGNOSIS — Z794 Long term (current) use of insulin: Secondary | ICD-10-CM

## 2022-10-26 DIAGNOSIS — E1165 Type 2 diabetes mellitus with hyperglycemia: Secondary | ICD-10-CM | POA: Diagnosis not present

## 2022-10-26 LAB — CBG MONITORING, ED
Glucose-Capillary: 338 mg/dL — ABNORMAL HIGH (ref 70–99)
Glucose-Capillary: 266 mg/dL — ABNORMAL HIGH (ref 70–99)
Glucose-Capillary: 319 mg/dL — ABNORMAL HIGH (ref 70–99)
Glucose-Capillary: 317 mg/dL — ABNORMAL HIGH (ref 70–99)
Glucose-Capillary: 320 mg/dL — ABNORMAL HIGH (ref 70–99)
Glucose-Capillary: 260 mg/dL — ABNORMAL HIGH (ref 70–99)

## 2022-10-26 LAB — CBC
HCT: 35.2 % — ABNORMAL LOW (ref 39.0–52.0)
Hemoglobin: 12 g/dL — ABNORMAL LOW (ref 13.0–17.0)
MCH: 31.5 pg (ref 26.0–34.0)
MCHC: 34.1 g/dL (ref 30.0–36.0)
MCV: 92.4 fL (ref 80.0–100.0)
Platelets: 129 10*3/uL — ABNORMAL LOW (ref 150–400)
RBC: 3.81 MIL/uL — ABNORMAL LOW (ref 4.22–5.81)
RDW: 13.1 % (ref 11.5–15.5)
WBC: 6.1 10*3/uL (ref 4.0–10.5)
nRBC: 0 % (ref 0.0–0.2)

## 2022-10-26 LAB — HEMOGLOBIN A1C
Hgb A1c MFr Bld: 8 % — ABNORMAL HIGH (ref 4.8–5.6)
Mean Plasma Glucose: 182.9 mg/dL

## 2022-10-26 LAB — URINALYSIS, COMPLETE (UACMP) WITH MICROSCOPIC
Bacteria, UA: NONE SEEN
Bilirubin Urine: NEGATIVE
Glucose, UA: 50 mg/dL — AB
Ketones, ur: NEGATIVE mg/dL
Leukocytes,Ua: NEGATIVE
Nitrite: NEGATIVE
Protein, ur: 30 mg/dL — AB
Specific Gravity, Urine: 1.015 (ref 1.005–1.030)
pH: 5 (ref 5.0–8.0)

## 2022-10-26 LAB — PROTEIN / CREATININE RATIO, URINE
Creatinine, Urine: 266 mg/dL
Protein Creatinine Ratio: 0.14 mg/mg{Cre} (ref 0.00–0.15)
Total Protein, Urine: 37 mg/dL

## 2022-10-26 LAB — COMPREHENSIVE METABOLIC PANEL
ALT: 28 U/L (ref 0–44)
AST: 40 U/L (ref 15–41)
Albumin: 3.1 g/dL — ABNORMAL LOW (ref 3.5–5.0)
Alkaline Phosphatase: 41 U/L (ref 38–126)
Anion gap: 16 — ABNORMAL HIGH (ref 5–15)
BUN: 139 mg/dL — ABNORMAL HIGH (ref 8–23)
CO2: 20 mmol/L — ABNORMAL LOW (ref 22–32)
Calcium: 8.3 mg/dL — ABNORMAL LOW (ref 8.9–10.3)
Chloride: 103 mmol/L (ref 98–111)
Creatinine, Ser: 6.16 mg/dL — ABNORMAL HIGH (ref 0.61–1.24)
GFR, Estimated: 9 mL/min — ABNORMAL LOW (ref >60)
Glucose, Bld: 331 mg/dL — ABNORMAL HIGH (ref 70–99)
Potassium: 4 mmol/L (ref 3.5–5.1)
Sodium: 139 mmol/L (ref 135–145)
Total Bilirubin: 0.6 mg/dL (ref 0.3–1.2)
Total Protein: 6.6 g/dL (ref 6.5–8.1)

## 2022-10-26 LAB — TROPONIN I (HIGH SENSITIVITY): Troponin I (High Sensitivity): 49 ng/L — ABNORMAL HIGH (ref <18)

## 2022-10-26 LAB — CREATININE, URINE, RANDOM: Creatinine, Urine: 264 mg/dL

## 2022-10-26 LAB — SODIUM, URINE, RANDOM: Sodium, Ur: 16 mmol/L

## 2022-10-26 MED ORDER — SODIUM BICARBONATE 650 MG PO TABS
650.0000 mg | ORAL_TABLET | Freq: Two times a day (BID) | ORAL | Status: DC
  Administered 2022-10-26 (×2): 650 mg via ORAL
  Filled 2022-10-26 (×3): qty 1

## 2022-10-26 MED ORDER — LACTATED RINGERS IV SOLN: INTRAVENOUS | Status: DC

## 2022-10-26 MED ORDER — INSULIN DETEMIR 100 UNIT/ML ~~LOC~~ SOLN
12.0000 [IU] | Freq: Two times a day (BID) | SUBCUTANEOUS | Status: DC
  Administered 2022-10-26 (×2): 12 [IU] via SUBCUTANEOUS
  Filled 2022-10-26 (×3): qty 0.12

## 2022-10-26 MED ORDER — TAMSULOSIN HCL 0.4 MG PO CAPS
0.4000 mg | ORAL_CAPSULE | Freq: Every day | ORAL | Status: DC
  Administered 2022-10-26 – 2022-10-28 (×3): 0.4 mg via ORAL
  Filled 2022-10-26 (×3): qty 1

## 2022-10-26 MED ORDER — INSULIN DETEMIR 100 UNIT/ML ~~LOC~~ SOLN: 6.0000 [IU] | Freq: Two times a day (BID) | SUBCUTANEOUS | Status: DC

## 2022-10-26 MED ORDER — SODIUM CHLORIDE 0.9 % IV BOLUS
500.0000 mL | Freq: Once | INTRAVENOUS | Status: AC
  Administered 2022-10-26: 500 mL via INTRAVENOUS

## 2022-10-26 NOTE — ED Notes (Signed)
Patient denies pain and is resting comfortably.  

## 2022-10-26 NOTE — Inpatient Diabetes Management (Signed)
Inpatient Diabetes Program Recommendations  AACE/ADA: New Consensus Statement on Inpatient Glycemic Control   Target Ranges:  Prepandial:   less than 140 mg/dL      Peak postprandial:   less than 180 mg/dL (1-2 hours)      Critically ill patients:  140 - 180 mg/dL    Latest Reference Range & Units 10/26/22 06:25 10/26/22 08:05  Glucose-Capillary 70 - 99 mg/dL 338 (H) 266 (H)   Review of Glycemic Control  Diabetes history: DM2 Outpatient Diabetes medications: Basaglar 50 units QAM, Basaglar 40 units QHS, Novolog 15 units TID withmeals Current orders for Inpatient glycemic control: Novolog 0-9 units TID with meals, Novolog 0-5 units QHS  Inpatient Diabetes Program Recommendations:    Insulin: Please consider ordering Semglee 8 units Q24H.  Thanks, Barnie Alderman, RN, MSN, [place] Diabetes Coordinator Inpatient Diabetes Program [phone] (Team Pager from 8am to [place])

## 2022-10-26 NOTE — Consult Note (Signed)
[facility]  CONSULT NOTE    Date: 10/26/2022                  Patient Name:  Alexander Cunningham  MRN: [id number]  DOB: 1951-06-13  Age / Sex: 72 y.o., male         PCP: Juluis Pitch, MD                 Service Requesting Consult: [place]                 Reason for Consult: Acute kidney injury            History of Present Illness: Mr. Jeronimo Hellberg is a 72 y.o.  male with past medical conditions including diabetes, GERD, UC, and congestive heart failure, who was admitted to [facility] on 10/25/2022 for Anuria [R34]  Patient presents to the emergency department due to abnormal labs at PCP.  Patient states he had been experiencing some fatigue and shortness of breath prior to ED arrival.  Patient states shortness of breath began about 3 days prior, even at rest.  Patient also reports testing positive for COVID-19 during office visit.  Poor appetite including nausea reported.  Denies history of kidney concerns.  Denies NSAID use.  Labs on ED arrival include sodium 133, potassium 6.9, serum bicarb 13, glucose 199, BUN 99, creatinine 8.76 with GFR 6, and WBCs 13.9.  UA appears hazy with hematuria.  Chest x-ray shows no acute findings.  Renal ultrasound negative for obstruction.  Elevated potassium treated with Lokelma and insulin/bicarb.  Sodium bicarb replacement ordered p.o. and LR for IV fluids.   Medications: Outpatient medications: (Not in a hospital admission)   Current medications: Current Facility-Administered Medications  Medication Dose Route Frequency Provider Last Rate Last Admin   acetaminophen (TYLENOL) tablet 650 mg  650 mg Oral Q6H PRN Gertie Fey, MD       Or   acetaminophen (TYLENOL) suppository 650 mg  650 mg Rectal Q6H PRN Gertie Fey, MD       azithromycin (ZITHROMAX) 500 mg in sodium chloride 0.9 % 250 mL IVPB  500 mg Intravenous Q24H Gertie Fey, MD   Stopped at 10/26/22 0541   cefTRIAXone (ROCEPHIN) 2 g in sodium chloride 0.9 % 100 mL IVPB   2 g Intravenous Q24H Gertie Fey, MD   Stopped at 10/25/22 2300   heparin injection 5,000 Units  5,000 Units Subcutaneous Q8H Gertie Fey, MD   5,000 Units at 10/26/22 1308   insulin aspart (novoLOG) injection 0-5 Units  0-5 Units Subcutaneous QHS Gertie Fey, MD   4 Units at 10/25/22 2245   insulin aspart (novoLOG) injection 0-9 Units  0-9 Units Subcutaneous TID WC Gertie Fey, MD   7 Units at 10/26/22 1307   insulin detemir (LEVEMIR) injection 12 Units  12 Units Subcutaneous BID Sharen Hones, MD       lactated ringers infusion   Intravenous Continuous Sharen Hones, MD       pantoprazole (PROTONIX) EC tablet 40 mg  40 mg Oral Daily Gertie Fey, MD   40 mg at 10/26/22 0910   polyethylene glycol (MIRALAX / GLYCOLAX) packet 17 g  17 g Oral Daily PRN Gertie Fey, MD       remdesivir 100 mg in sodium chloride 0.9 % 100 mL IVPB  100 mg Intravenous Daily Gertie Fey, MD   Stopped at 10/26/22 1000   sodium bicarbonate tablet 650 mg  650 mg Oral BID Sharen Hones, MD  sodium chloride flush (NS) 0.9 % injection 3 mL  3 mL Intravenous Q12H Gertie Fey, MD   3 mL at 10/26/22 0911   tamsulosin (FLOMAX) capsule 0.4 mg  0.4 mg Oral Daily Sharen Hones, MD       Current Outpatient Medications  Medication Sig Dispense Refill   ferrous sulfate 325 (65 FE) MG tablet Take 325 mg by mouth every morning.     gabapentin (NEURONTIN) 300 MG capsule Take 300 mg by mouth 3 (three) times daily.     insulin aspart (NOVOLOG) 100 UNIT/ML FlexPen Inject 15 Units into the skin in the morning, at noon, and at bedtime.     Insulin Glargine (BASAGLAR KWIKPEN) 100 UNIT/ML Inject 40-50 Units into the skin See admin instructions. Inject 50u under the skin every morning and inject 40u under the skin every night     lisinopril (ZESTRIL) 5 MG tablet Take 5 mg by mouth every other day.     magnesium oxide (MAG-OX) 400 MG tablet Take 1 tablet by mouth 2 (two) times daily.     omeprazole (PRILOSEC) 40 MG capsule Take 40 mg by mouth  daily.     propranolol (INDERAL) 10 MG tablet Take 10 mg by mouth 2 (two) times daily.        Allergies: Allergies  Allergen Reactions   Contrast Media [Iodinated Contrast Media] Rash   Other Rash    Pt states the bed sheets at the hospital gave him a bad purple rash on his back.   Oxycodone Hives   Tricor [Fenofibrate] Rash      Past Medical History: Past Medical History:  Diagnosis Date   CHF (congestive heart failure) (HCC)    Diabetes mellitus without complication (HCC)    GERD (gastroesophageal reflux disease)    Ulcerative colitis ([place])      Past Surgical History: Past Surgical History:  Procedure Laterality Date   COLON SURGERY     ESOPHAGOGASTRODUODENOSCOPY N/A 08/17/2021   Procedure: ESOPHAGOGASTRODUODENOSCOPY (EGD);  Surgeon: Annamaria Helling, DO;  Location: [facility] ENDOSCOPY;  Service: Gastroenterology;  Laterality: N/A;  IDDM   ESOPHAGOGASTRODUODENOSCOPY N/A 08/30/2022   Procedure: ESOPHAGOGASTRODUODENOSCOPY (EGD);  Surgeon: Annamaria Helling, DO;  Location: [facility] ENDOSCOPY;  Service: Gastroenterology;  Laterality: N/A;   HERNIA REPAIR       Family History: No family history on file.   Social History: Social History   Socioeconomic History   Marital status: Married    Spouse name: Not on file   Number of children: Not on file   Years of education: Not on file   Highest education level: Not on file  Occupational History   Not on file  Tobacco Use   Smoking status: Former    Types: Cigarettes   Smokeless tobacco: Never  Vaping Use   Vaping Use: Never used  Substance and Sexual Activity   Alcohol use: Yes    Comment: 3 beers in 4 months   Drug use: Never   Sexual activity: Not on file  Other Topics Concern   Not on file  Social History Narrative   Not on file   Social Determinants of Health   Financial Resource Strain: Not on file  Food Insecurity: Not on file  Transportation Needs: Not on file  Physical Activity: Not on file   Stress: Not on file  Social Connections: Not on file  Intimate Partner Violence: Not on file     Review of Systems: Review of Systems  Constitutional:  Negative for chills,  fever and malaise/fatigue.  HENT:  Negative for congestion, sore throat and tinnitus.   Eyes:  Negative for blurred vision and redness.  Respiratory:  Positive for shortness of breath. Negative for cough and wheezing.   Cardiovascular:  Negative for chest pain, palpitations, claudication and leg swelling.  Gastrointestinal:  Positive for nausea. Negative for abdominal pain, blood in stool, diarrhea and vomiting.  Genitourinary:  Negative for flank pain, frequency and hematuria.  Musculoskeletal:  Negative for back pain, falls and myalgias.  Skin:  Negative for rash.  Neurological:  Negative for dizziness, weakness and headaches.  Endo/Heme/Allergies:  Does not bruise/bleed easily.  Psychiatric/Behavioral:  Negative for depression. The patient is not nervous/anxious and does not have insomnia.     Vital Signs: Blood pressure (!) 103/45, pulse (!) 102, temperature 97.9 F (36.6 C), temperature source Oral, resp. rate 16, height 5\' 10"  (1.778 m), weight 78 kg, SpO2 99 %.  Weight trends: Filed Weights   10/25/22 1845 10/26/22 0825  Weight: 78 kg 78 kg    Physical Exam: General: NAD, ill-appearing  Head: Normocephalic, atraumatic.  Dry oral mucosal membranes  Eyes: Anicteric  Lungs:  Clear to auscultation, normal effort  Heart: Regular rate and rhythm  Abdomen:  Soft, nontender, nondistended  Extremities: No peripheral edema.  Neurologic: Alert and oriented, moving all four extremities  Skin: No lesions  Access: None     Lab results: Basic Metabolic Panel: Recent Labs  Lab 10/25/22 1855 10/26/22 0627  NA 133* 139  K 6.9* 4.0  CL 105 103  CO2 13* 20*  GLUCOSE 199* 331*  BUN 99* 139*  CREATININE 8.76* 6.16*  CALCIUM 9.6 8.3*  MG 2.1  --     Liver Function Tests: Recent Labs  Lab  10/25/22 1855 10/26/22 0627  AST 31 40  ALT 34 28  ALKPHOS 57 41  BILITOT 1.2 0.6  PROT 8.8* 6.6  ALBUMIN 4.2 3.1*   No results for input(s): "LIPASE", "AMYLASE" in the last 168 hours. No results for input(s): "AMMONIA" in the last 168 hours.  CBC: Recent Labs  Lab 10/25/22 1855 10/26/22 0627  WBC 13.9* 6.1  HGB 15.2 12.0*  HCT 46.8 35.2*  MCV 95.7 92.4  PLT 362 129*    Cardiac Enzymes: No results for input(s): "CKTOTAL", "CKMB", "CKMBINDEX", "TROPONINI" in the last 168 hours.  BNP: Invalid input(s): "POCBNP"  CBG: Recent Labs  Lab 10/26/22 0625 10/26/22 0805 10/26/22 1228  GLUCAP 338* 266* 319*    Microbiology: Results for orders placed or performed during the hospital encounter of 10/25/22  Culture, blood (Routine X 2) w Reflex to ID Panel     Status: None (Preliminary result)   Collection Time: 10/25/22 11:54 PM   Specimen: BLOOD  Result Value Ref Range Status   Specimen Description BLOOD LEFT AC  Final   Special Requests   Final    BOTTLES DRAWN AEROBIC AND ANAEROBIC Blood Culture adequate volume   Culture   Final    NO GROWTH < 12 HOURS Performed at [facility], [address] [place], [place]    Report Status PENDING  Incomplete  Culture, blood (Routine X 2) w Reflex to ID Panel     Status: None (Preliminary result)   Collection Time: 10/26/22 12:07 AM   Specimen: BLOOD  Result Value Ref Range Status   Specimen Description BLOOD RIGHT [facility]  Final   Special Requests   Final    BOTTLES DRAWN AEROBIC AND ANAEROBIC Blood Culture adequate  volume   Culture   Final    NO GROWTH < 12 HOURS Performed at [facility], [address] [place], [place] [address]    Report Status PENDING  Incomplete    Coagulation Studies: Recent Labs    10/25/22 1855  LABPROT 17.4*  INR 1.4*    Urinalysis: Recent Labs    10/25/22 2354  COLORURINE YELLOW*  LABSPEC 1.015  PHURINE 5.0  GLUCOSEU 50*  HGBUR MODERATE*  BILIRUBINUR  NEGATIVE  KETONESUR NEGATIVE  PROTEINUR 30*  NITRITE NEGATIVE  LEUKOCYTESUR NEGATIVE      Imaging: US RENAL  Result Date: 10/26/2022 CLINICAL DATA:  Anuria EXAM: RENAL / URINARY TRACT ULTRASOUND COMPLETE COMPARISON:  None Available. FINDINGS: Right Kidney: Renal measurements: 10.0 x 4.8 x 4.7 cm = volume: 117 mL. Echogenicity within normal limits. No mass or hydronephrosis visualized. Left Kidney: Renal measurements: 12.3 x 2.2 x 5.1 cm = volume: 204 mL. Echogenicity within normal limits. No mass or hydronephrosis visualized. 14 mm simple cyst noted within the upper pole the left kidney for which no follow-up imaging is recommended. Bladder: Decompressed with Foley catheter balloon seen Other: None. IMPRESSION: 1. Normal renal sonogram. Electronically Signed   By: Helyn Numbers M.D.   On: 10/26/2022 03:03   DG Chest Portable 1 View  Result Date: 10/25/2022 CLINICAL DATA:  Weakness EXAM: PORTABLE CHEST 1 VIEW COMPARISON:  Chest x-ray 06/09/2021 FINDINGS: The heart size and mediastinal contours are within normal limits. There is some scattered chronic appearing interstitial opacities in the left lower lung. There is no focal lung infiltrate. Costophrenic angles are clear. No pneumothorax. The cardiomediastinal silhouette is within normal limits. The visualized skeletal structures are unremarkable. IMPRESSION: No active disease. Chronic appearing interstitial opacities in the left lower lung. Electronically Signed   By: Darliss Cheney M.D.   On: 10/25/2022 20:16     Assessment & Plan: Mr. Zigmond Trela is a 72 y.o.  male with  past medical conditions including diabetes, GERD, UC, and congestive heart failure, who was admitted to [facility] on 10/25/2022 for Anuria [R34]  Acute kidney injury with hyperkalemia on chronic kidney disease stage IIIa.  Baseline creatinine appears to be 1.4 with GFR 53 on 09/21/2022.  Acute kidney injury appears secondary to hypovolemia.  Urinary retention also reported in  ED, Foley catheter placed.  Renal ultrasound negative for obstruction.  CT abdomen pelvis suspicious for acute cystitis.  Agree with IV fluids.  Creatinine has began to improve.  No acute need for dialysis at this time.  Continue to avoid nephrotoxic agents and therapies including hypotension.  2. Anemia of chronic kidney disease Lab Results  Component Value Date   HGB 12.0 (L) 10/26/2022    Hemoglobin stable.  3. Diabetes mellitus type II with chronic kidney disease/renal manifestations: insulin dependent. Home regimen includes glargine and NovoLog. Most recent hemoglobin A1c is 8.0 on 1/25 last 24.    4.  Hypertension with chronic kidney disease.  Home regimen includes lisinopril and propranolol.  Both currently held at this time.  LOS: 1   1/26/20242:21 PM

## 2022-10-26 NOTE — Progress Notes (Signed)
Dear Doctor:  This patient has been identified as a candidate for central line for the following reason (s): Poor veins, prolonged therapy, If you agree, please write an order for the indicated device. .  Thank you for supporting the early vascular access assessment program.

## 2022-10-26 NOTE — ED Notes (Signed)
Patient blood pressure is 88/45, Dr. Roosevelt Locks notified.

## 2022-10-26 NOTE — Consult Note (Addendum)
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 Stomal assessment/size: Not measured today Peristomal assessment: Not seen today Treatment options for stomal/peristomal skin: skin barrier ring Output: stool present. Ostomy pouching:  2-piece, 2 and 3/4 inch ostomy pouching system with skin barrier ring. Pouch is [employer], Skin barrier is Kellie Simmering # 2, skin barrier ring is Actor # [id number]. Please order 5 of each to bedside. Note: Stretch ring to size of ostomy and place on skin prior to pouching. Education provided: None today Enrolled patient in [place] program: No. Patient with established ostomy and supplier.   [place] nursing team will not follow, but will remain available to this patient, the nursing and medical teams.  Please re-consult if needed.  Thank you for inviting [place] to participate in this patient's Plan of Care.  Maudie Flakes, MSN, RN, CNS, [place], Serita Grammes, [employer], [employer] phone:  [phone]

## 2022-10-26 NOTE — Hospital Course (Signed)
Alexander Cunningham is a 72 y.o. male reports being in his usual state of health till about 2 days ago.  Has significant fatigue, he has not been eating for the last 2 days due to lack of appetite.  He also has some short of breath without a cough. Upon arriving emergency room, he was found to be COVID-positive.  He had a significant renal failure with creatinine of 8976, potassium 6.9, CO2 13.  Patient was placed on bicarb drip.  Patient was also given remdesivir for COVID infection.  He has not been requiring oxygen.  Chest x-ray did not show any evidence of pneumonia.

## 2022-10-26 NOTE — Progress Notes (Signed)
  Progress Note   Patient: Alexander Cunningham EYC:[id number] DOB: 16-May-1951 DOA: 10/25/2022     1 DOS: the patient was seen and examined on 10/26/2022   Brief hospital course: Alexander Cunningham is a 72 y.o. male reports being in his usual state of health till about 2 days ago.  Has significant fatigue, he has not been eating for the last 2 days due to lack of appetite.  He also has some short of breath without a cough. Upon arriving emergency room, he was found to be COVID-positive.  He had a significant renal failure with creatinine of 8976, potassium 6.9, CO2 13.  Patient was placed on bicarb drip.  Patient was also given remdesivir for COVID infection.  He has not been requiring oxygen.  Chest x-ray did not show any evidence of pneumonia.  Assessment and Plan: * Hyperkalemia Acute renal failure superimposed on stage 3a chronic kidney disease (HCC) Metabolic acidosis Urinary retention secondary to benign prostate hypertrophy. Patient developed acute renal failure with a severe elevation of her creatinine associated with severe hyperkalemia and metabolic acidosis.  He received a bicarb drip, renal function is slowly improving.  Foley catheter was also anchored, he had a residual of 500 mL, since that time he has been making urine. Will continue lactated Ringer solution, also added sodium bicarbonate orally.  Patient will be followed by nephrology. I will also add Flomax.  Uncontrolled type 2 diabetes with hyperglycemia. Glucose has been running high, he was on 40 to 50 units of Lantus at home, I will start 12 units of Levemir twice a day in addition to sliding scale insulin.  Will follow glucose closely.  COVID-19 virus infection Patient does not have any hypoxemia, chest x-ray did not show pneumonia.  Patient started on remdesivir, will continue.        Subjective:  Patient feels better today, short of breath is improving.  No cough.  Physical Exam: Vitals:   10/26/22 0825 10/26/22  0900 10/26/22 0930 10/26/22 1140  BP:  (!) 108/45 (!) 107/52 (!) 103/45  Pulse:  (!) 101 100 (!) 102  Resp:  18 18 16   Temp:      TempSrc:      SpO2:    99%  Weight: 78 kg     Height: 5\' 10"  (1.778 m)      General exam: Appears calm and comfortable  Respiratory system: Clear to auscultation. Respiratory effort normal. Cardiovascular system: S1 & S2 heard, RRR. No JVD, murmurs, rubs, gallops or clicks. No pedal edema. Gastrointestinal system: Abdomen is nondistended, soft and nontender. No organomegaly or masses felt. Normal bowel sounds heard. Central nervous system: Alert and oriented. No focal neurological deficits. Extremities: Symmetric 5 x 5 power. Skin: No rashes, lesions or ulcers Psychiatry: Judgement and insight appear normal. Mood & affect appropriate.   Data Reviewed:  Reviewed chest x-ray, renal ultrasound results, all lab results.  Family Communication: wife updated  Disposition: Status is: Inpatient Remains inpatient appropriate because: Severity of disease, IV treatment.  Planned Discharge Destination: Home    Time spent: 50 minutes  Author: Sharen Hones, MD 10/26/2022 2:14 PM  For on call review [email or web address].

## 2022-10-27 DIAGNOSIS — E875 Hyperkalemia: Secondary | ICD-10-CM | POA: Diagnosis not present

## 2022-10-27 DIAGNOSIS — U071 COVID-19: Secondary | ICD-10-CM | POA: Diagnosis not present

## 2022-10-27 DIAGNOSIS — N179 Acute kidney failure, unspecified: Secondary | ICD-10-CM | POA: Diagnosis present

## 2022-10-27 DIAGNOSIS — D696 Thrombocytopenia, unspecified: Secondary | ICD-10-CM | POA: Insufficient documentation

## 2022-10-27 DIAGNOSIS — N170 Acute kidney failure with tubular necrosis: Secondary | ICD-10-CM | POA: Diagnosis not present

## 2022-10-27 DIAGNOSIS — N1831 Chronic kidney disease, stage 3a: Secondary | ICD-10-CM | POA: Diagnosis not present

## 2022-10-27 LAB — BASIC METABOLIC PANEL
Anion gap: 13 (ref 5–15)
BUN: 127 mg/dL — ABNORMAL HIGH (ref 8–23)
CO2: 19 mmol/L — ABNORMAL LOW (ref 22–32)
Calcium: 7.9 mg/dL — ABNORMAL LOW (ref 8.9–10.3)
Chloride: 108 mmol/L (ref 98–111)
Creatinine, Ser: 3.66 mg/dL — ABNORMAL HIGH (ref 0.61–1.24)
GFR, Estimated: 17 mL/min — ABNORMAL LOW (ref >60)
Glucose, Bld: 203 mg/dL — ABNORMAL HIGH (ref 70–99)
Potassium: 4.1 mmol/L (ref 3.5–5.1)
Sodium: 140 mmol/L (ref 135–145)

## 2022-10-27 LAB — CBC
HCT: 28.6 % — ABNORMAL LOW (ref 39.0–52.0)
Hemoglobin: 9.7 g/dL — ABNORMAL LOW (ref 13.0–17.0)
MCH: 31.4 pg (ref 26.0–34.0)
MCHC: 33.9 g/dL (ref 30.0–36.0)
MCV: 92.6 fL (ref 80.0–100.0)
Platelets: 107 10*3/uL — ABNORMAL LOW (ref 150–400)
RBC: 3.09 MIL/uL — ABNORMAL LOW (ref 4.22–5.81)
RDW: 13.2 % (ref 11.5–15.5)
WBC: 5 10*3/uL (ref 4.0–10.5)
nRBC: 0 % (ref 0.0–0.2)

## 2022-10-27 LAB — IRON AND TIBC
Iron: 24 ug/dL — ABNORMAL LOW (ref 45–182)
Saturation Ratios: 11 % — ABNORMAL LOW (ref 17.9–39.5)
TIBC: 210 ug/dL — ABNORMAL LOW (ref 250–450)
UIBC: 186 ug/dL

## 2022-10-27 LAB — MAGNESIUM: Magnesium: 1.4 mg/dL — ABNORMAL LOW (ref 1.7–2.4)

## 2022-10-27 LAB — GLUCOSE, CAPILLARY
Glucose-Capillary: 266 mg/dL — ABNORMAL HIGH (ref 70–99)
Glucose-Capillary: 257 mg/dL — ABNORMAL HIGH (ref 70–99)

## 2022-10-27 LAB — CBG MONITORING, ED
Glucose-Capillary: 184 mg/dL — ABNORMAL HIGH (ref 70–99)
Glucose-Capillary: 226 mg/dL — ABNORMAL HIGH (ref 70–99)

## 2022-10-27 LAB — FERRITIN: Ferritin: 326 ng/mL (ref 24–336)

## 2022-10-27 MED ORDER — INSULIN DETEMIR 100 UNIT/ML ~~LOC~~ SOLN
20.0000 [IU] | Freq: Two times a day (BID) | SUBCUTANEOUS | Status: DC
  Administered 2022-10-27 – 2022-10-28 (×3): 20 [IU] via SUBCUTANEOUS
  Filled 2022-10-27 (×5): qty 0.2

## 2022-10-27 MED ORDER — MAGNESIUM SULFATE 4 GM/100ML IV SOLN
4.0000 g | Freq: Once | INTRAVENOUS | Status: AC
  Administered 2022-10-27: 4 g via INTRAVENOUS
  Filled 2022-10-27: qty 100

## 2022-10-27 MED ORDER — SODIUM BICARBONATE 650 MG PO TABS
1300.0000 mg | ORAL_TABLET | Freq: Two times a day (BID) | ORAL | Status: DC
  Administered 2022-10-27 – 2022-10-28 (×3): 1300 mg via ORAL
  Filled 2022-10-27 (×3): qty 2

## 2022-10-27 NOTE — ED Notes (Signed)
3 unsuccessful IV attempts by IV team. MD notified. VO to discontinue remdesivir and push PO fluids.

## 2022-10-27 NOTE — ED Notes (Signed)
Called for transport for patient 

## 2022-10-27 NOTE — Progress Notes (Signed)
Progress Note   Patient: Alexander Cunningham WUJ:[id number] DOB: 02-06-1951 DOA: 10/25/2022     2 DOS: the patient was seen and examined on 10/27/2022   Brief hospital course: Alexander Cunningham is a 72 y.o. male reports being in his usual state of health till about 2 days ago.  Has significant fatigue, he has not been eating for the last 2 days due to lack of appetite.  He also has some short of breath without a cough. Upon arriving emergency room, he was found to be COVID-positive.  He had a significant renal failure with creatinine of 8976, potassium 6.9, CO2 13.  Patient was placed on bicarb drip.  Patient was also given remdesivir for COVID infection.  He has not been requiring oxygen.  Chest x-ray did not show any evidence of pneumonia.  Assessment and Plan:  Hyperkalemia Acute renal failure superimposed on stage 3a chronic kidney disease (HCC) Metabolic acidosis Urinary retention secondary to benign prostate hypertrophy. Patient developed acute renal failure with a severe elevation of her creatinine associated with severe hyperkalemia and metabolic acidosis.  He received a bicarb drip, renal function is slowly improving.  Foley catheter was also anchored, he had a residual of 500 mL, since that time he has been making urine. Renal function continue to improve, continue Foley catheter.  Continue IV fluids.  Continue Flomax for urinary retention. Patient IV has infiltrated, I am reluctant to place a central line versus PICC line as patient condition is already improving.  If you cannot get another peripheral IV, okay to leave IV out.  Encourage p.o. intake. Recheck BMP and magnesium tomorrow.  Hypomagnesemia. Received 4 g of magnesium sulfate.  Recheck level tomorrow.   Uncontrolled type 2 diabetes with hyperglycemia. Closely running high, increase Levemir dose.  Continue sliding scale insulin.   COVID-19 virus infection Thrombocytopenia. Still has no hypoxemia, prior x-ray has no  pneumonia.  No short of breath.  Completed 1 dose of remdesivir.  If no IV can be established, okay to hold additional dose of remdesivir.  Check a CBC tomorrow.  Anemia. Hemoglobin dropped today, probably due to IV resuscitation.  No evidence of GI bleed.  Recheck CBC tomorrow.      Subjective:  Patient doing well today, currently no complaints.  Physical Exam: Vitals:   10/27/22 0441 10/27/22 0800 10/27/22 0900 10/27/22 1215  BP: (!) 118/48 (!) 123/43  (!) 115/49  Pulse: 92 84 (!) 102 90  Resp: 20 18 15 19   Temp: 98.5 F (36.9 C)     TempSrc: Oral     SpO2: 97% 99% 98% 98%  Weight:      Height:       General exam: Appears calm and comfortable  Respiratory system: Clear to auscultation. Respiratory effort normal. Cardiovascular system: S1 & S2 heard, RRR. No JVD, murmurs, rubs, gallops or clicks. No pedal edema. Gastrointestinal system: Abdomen is nondistended, soft and nontender. No organomegaly or masses felt. Normal bowel sounds heard. Central nervous system: Alert and oriented x2. No focal neurological deficits. Extremities: Symmetric 5 x 5 power. Skin: No rashes, lesions or ulcers Psychiatry: Judgement and insight appear normal. Mood & affect appropriate. '  Data Reviewed:  Lab results reviewed.  Family Communication: Son updated at bedside.  Disposition: Status is: Inpatient Remains inpatient appropriate because: Severity of disease, IV treatment.  Planned Discharge Destination: Home with Home Health    Time spent: 35 minutes  Author: Sharen Hones, MD 10/27/2022 2:43 PM  For on call review [email or web address].

## 2022-10-27 NOTE — Progress Notes (Signed)
Another consult was placed to the IV Team today;  attempted x 3 to place another iv site,  using ultrasound;  unable to thread catheters into veins;  see progress note from 1-26 also recommending central access if needed.  Thank you!

## 2022-10-27 NOTE — Progress Notes (Signed)
[facility]  ROUNDING NOTE   Subjective:   Patient seen resting in bed Alert and oriented Continues to have poor appetite, denies nausea and vomiting Remains on room air Lower extremity edema   Objective:  Vital signs in last 24 hours:  Temp:  [98.4 F (36.9 C)-99.3 F (37.4 C)] 98.5 F (36.9 C) (01/27 0441) Pulse Rate:  [84-110] 102 (01/27 0900) Resp:  [15-22] 15 (01/27 0900) BP: (88-123)/(43-49) 123/43 (01/27 0800) SpO2:  [96 %-99 %] 98 % (01/27 0900)  Weight change: 0 kg Filed Weights   10/25/22 1845 10/26/22 0825  Weight: 78 kg 78 kg    Intake/Output: I/O last 3 completed shifts: In: 1594.7 [I.V.:1151.1; IV Piggyback:443.6] Out: 1050 [Urine:950; Stool:100]   Intake/Output this shift:  Total I/O In: -  Out: 500 [Urine:500]  Physical Exam: General: NAD  Head: Normocephalic, atraumatic. Moist oral mucosal membranes  Eyes: Anicteric  Lungs:  Clear to auscultation, normal effort, room air  Heart: Regular rate and rhythm  Abdomen:  Soft, nontender, nondistended  Extremities: No peripheral edema.  Neurologic: Nonfocal, moving all four extremities  Skin: No lesions  Access: None    Basic Metabolic Panel: Recent Labs  Lab 10/25/22 1855 10/26/22 0627 10/27/22 0400  NA 133* 139 140  K 6.9* 4.0 4.1  CL 105 103 108  CO2 13* 20* 19*  GLUCOSE 199* 331* 203*  BUN 99* 139* 127*  CREATININE 8.76* 6.16* 3.66*  CALCIUM 9.6 8.3* 7.9*  MG 2.1  --  1.4*    Liver Function Tests: Recent Labs  Lab 10/25/22 1855 10/26/22 0627  AST 31 40  ALT 34 28  ALKPHOS 57 41  BILITOT 1.2 0.6  PROT 8.8* 6.6  ALBUMIN 4.2 3.1*   No results for input(s): "LIPASE", "AMYLASE" in the last 168 hours. No results for input(s): "AMMONIA" in the last 168 hours.  CBC: Recent Labs  Lab 10/25/22 1855 10/26/22 0627 10/27/22 0400  WBC 13.9* 6.1 5.0  HGB 15.2 12.0* 9.7*  HCT 46.8 35.2* 28.6*  MCV 95.7 92.4 92.6  PLT 362 129* 107*    Cardiac Enzymes: No  results for input(s): "CKTOTAL", "CKMB", "CKMBINDEX", "TROPONINI" in the last 168 hours.  BNP: Invalid input(s): "POCBNP"  CBG: Recent Labs  Lab 10/26/22 1525 10/26/22 1640 10/26/22 2305 10/27/22 0750 10/27/22 1139  GLUCAP 317* 320* 260* 184* 226*    Microbiology: Results for orders placed or performed during the hospital encounter of 10/25/22  Culture, blood (Routine X 2) w Reflex to ID Panel     Status: None (Preliminary result)   Collection Time: 10/25/22 11:54 PM   Specimen: BLOOD  Result Value Ref Range Status   Specimen Description BLOOD LEFT [facility]  Final   Special Requests   Final    BOTTLES DRAWN AEROBIC AND ANAEROBIC Blood Culture adequate volume   Culture   Final    NO GROWTH 1 DAY Performed at [facility], [address], [place], [place] [address]    Report Status PENDING  Incomplete  Culture, blood (Routine X 2) w Reflex to ID Panel     Status: None (Preliminary result)   Collection Time: 10/26/22 12:07 AM   Specimen: BLOOD  Result Value Ref Range Status   Specimen Description BLOOD RIGHT [facility]  Final   Special Requests   Final    BOTTLES DRAWN AEROBIC AND ANAEROBIC Blood Culture adequate volume   Culture   Final    NO GROWTH 1 DAY Performed at [facility], [address], [place],  [address]    Report Status PENDING  Incomplete    Coagulation Studies: Recent Labs    10/25/22 1855  LABPROT 17.4*  INR 1.4*    Urinalysis: Recent Labs    10/25/22 2354  COLORURINE YELLOW*  LABSPEC 1.015  PHURINE 5.0  GLUCOSEU 50*  HGBUR MODERATE*  BILIRUBINUR NEGATIVE  KETONESUR NEGATIVE  PROTEINUR 30*  NITRITE NEGATIVE  LEUKOCYTESUR NEGATIVE      Imaging: US RENAL  Result Date: 10/26/2022 CLINICAL DATA:  Anuria EXAM: RENAL / URINARY TRACT ULTRASOUND COMPLETE COMPARISON:  None Available. FINDINGS: Right Kidney: Renal measurements: 10.0 x 4.8 x 4.7 cm = volume: 117 mL. Echogenicity within normal limits. No mass or hydronephrosis  visualized. Left Kidney: Renal measurements: 12.3 x 2.2 x 5.1 cm = volume: 204 mL. Echogenicity within normal limits. No mass or hydronephrosis visualized. 14 mm simple cyst noted within the upper pole the left kidney for which no follow-up imaging is recommended. Bladder: Decompressed with Foley catheter balloon seen Other: None. IMPRESSION: 1. Normal renal sonogram. Electronically Signed   By: Fidela Salisbury M.D.   On: 10/26/2022 03:03   DG Chest Portable 1 View  Result Date: 10/25/2022 CLINICAL DATA:  Weakness EXAM: PORTABLE CHEST 1 VIEW COMPARISON:  Chest x-ray 06/09/2021 FINDINGS: The heart size and mediastinal contours are within normal limits. There is some scattered chronic appearing interstitial opacities in the left lower lung. There is no focal lung infiltrate. Costophrenic angles are clear. No pneumothorax. The cardiomediastinal silhouette is within normal limits. The visualized skeletal structures are unremarkable. IMPRESSION: No active disease. Chronic appearing interstitial opacities in the left lower lung. Electronically Signed   By: Ronney Asters M.D.   On: 10/25/2022 20:16     Medications:    azithromycin Stopped (10/27/22 0012)   cefTRIAXone (ROCEPHIN)  IV Stopped (10/26/22 2330)   lactated ringers 100 mL/hr at 10/27/22 0359   remdesivir 100 mg in sodium chloride 0.9 % 100 mL IVPB Stopped (10/26/22 1000)    heparin  5,000 Units Subcutaneous Q8H   insulin aspart  0-5 Units Subcutaneous QHS   insulin aspart  0-9 Units Subcutaneous TID WC   insulin detemir  20 Units Subcutaneous BID   pantoprazole  40 mg Oral Daily   sodium bicarbonate  1,300 mg Oral BID   tamsulosin  0.4 mg Oral Daily   acetaminophen **OR** acetaminophen, polyethylene glycol  Assessment/ Plan:  Alexander Cunningham is a 72 y.o.  male with past medical conditions including diabetes, GERD, UC, and congestive heart failure, who was admitted to [facility] on 10/25/2022 for Anuria [R34]    Acute kidney injury with  hyperkalemia on chronic kidney disease stage IIIa.  Baseline creatinine appears to be 1.4 with GFR 53 on 09/21/2022.  Acute kidney injury appears secondary to hypovolemia.  Urinary retention also reported in ED, Foley catheter placed.  Renal ultrasound negative for obstruction.  CT abdomen pelvis suspicious for acute cystitis.  Creatinine continues to improve with IV fluids.  Adequate urine output noted on day shift yesterday.  Continue IV fluids until oral intake improves.  No acute need for dialysis at this time.  Continue to avoid nephrotoxic agents and therapies including hypotension.    Lab Results  Component Value Date   CREATININE 3.66 (H) 10/27/2022   CREATININE 6.16 (H) 10/26/2022   CREATININE 8.76 (H) 10/25/2022    Intake/Output Summary (Last 24 hours) at 10/27/2022 1203 Last data filed at 10/27/2022 0706 Gross per 24 hour  Intake 350  ml  Output 1550 ml  Net -1200 ml   2. Anemia of chronic kidney disease Lab Results  Component Value Date   HGB 9.7 (L) 10/27/2022    Hemoglobin acceptable at this time.  3. Diabetes mellitus type II with chronic kidney disease/renal manifestations: insulin dependent. Home regimen includes glargine and NovoLog. Most recent hemoglobin A1c is 8.0 on 1/25 last 24.    Hemoglobin slightly elevated, sliding scale managed by primary team.   4.  Hypertension with chronic kidney disease.  Home regimen includes lisinopril and propranolol.  Both currently held.  Blood pressure acceptable, 115/49.   LOS: 2 [place] 1/27/202412:03 PM

## 2022-10-27 NOTE — ED Notes (Signed)
Pt stable at time of departure. To be transferred to room via bed.

## 2022-10-27 NOTE — ED Notes (Signed)
IV infiltrated upon this nurses assessment. Arm swollen and warm to touch. In report RN was told that the IV above this IV site had infiltrated, but was told that the infusing IV looked fine. MD notified of infiltrated IV. IV team consult order placed by charge RN.

## 2022-10-27 NOTE — ED Notes (Signed)
Meal tray given to patient.

## 2022-10-27 NOTE — Plan of Care (Signed)
  Problem: Education: Goal: Ability to describe self-care measures that may prevent or decrease complications (Diabetes Survival Skills Education) will improve Outcome: Progressing   Problem: Education: Goal: Knowledge of General Education information will improve Description: Including pain rating scale, medication(s)/side effects and non-pharmacologic comfort measures Outcome: Progressing   Problem: Activity: Goal: Risk for activity intolerance will decrease Outcome: Progressing

## 2022-10-28 ENCOUNTER — Encounter: Payer: Self-pay | Admitting: Internal Medicine

## 2022-10-28 DIAGNOSIS — N401 Enlarged prostate with lower urinary tract symptoms: Secondary | ICD-10-CM | POA: Diagnosis not present

## 2022-10-28 DIAGNOSIS — U071 COVID-19: Secondary | ICD-10-CM | POA: Diagnosis not present

## 2022-10-28 DIAGNOSIS — E875 Hyperkalemia: Secondary | ICD-10-CM | POA: Diagnosis not present

## 2022-10-28 DIAGNOSIS — R338 Other retention of urine: Secondary | ICD-10-CM

## 2022-10-28 DIAGNOSIS — N170 Acute kidney failure with tubular necrosis: Secondary | ICD-10-CM | POA: Diagnosis not present

## 2022-10-28 DIAGNOSIS — E872 Acidosis, unspecified: Secondary | ICD-10-CM

## 2022-10-28 LAB — CBC
HCT: 29.5 % — ABNORMAL LOW (ref 39.0–52.0)
Hemoglobin: 9.9 g/dL — ABNORMAL LOW (ref 13.0–17.0)
MCH: 31.3 pg (ref 26.0–34.0)
MCHC: 33.6 g/dL (ref 30.0–36.0)
MCV: 93.4 fL (ref 80.0–100.0)
Platelets: 115 10*3/uL — ABNORMAL LOW (ref 150–400)
RBC: 3.16 MIL/uL — ABNORMAL LOW (ref 4.22–5.81)
RDW: 13.1 % (ref 11.5–15.5)
WBC: 4.3 10*3/uL (ref 4.0–10.5)
nRBC: 0 % (ref 0.0–0.2)

## 2022-10-28 LAB — BASIC METABOLIC PANEL
Anion gap: 11 (ref 5–15)
BUN: 94 mg/dL — ABNORMAL HIGH (ref 8–23)
CO2: 21 mmol/L — ABNORMAL LOW (ref 22–32)
Calcium: 8.4 mg/dL — ABNORMAL LOW (ref 8.9–10.3)
Chloride: 107 mmol/L (ref 98–111)
Creatinine, Ser: 1.94 mg/dL — ABNORMAL HIGH (ref 0.61–1.24)
GFR, Estimated: 36 mL/min — ABNORMAL LOW (ref >60)
Glucose, Bld: 186 mg/dL — ABNORMAL HIGH (ref 70–99)
Potassium: 3.7 mmol/L (ref 3.5–5.1)
Sodium: 139 mmol/L (ref 135–145)

## 2022-10-28 LAB — MAGNESIUM: Magnesium: 2.1 mg/dL (ref 1.7–2.4)

## 2022-10-28 LAB — GLUCOSE, CAPILLARY
Glucose-Capillary: 161 mg/dL — ABNORMAL HIGH (ref 70–99)
Glucose-Capillary: 261 mg/dL — ABNORMAL HIGH (ref 70–99)
Glucose-Capillary: 267 mg/dL — ABNORMAL HIGH (ref 70–99)

## 2022-10-28 LAB — VITAMIN B12: Vitamin B-12: 739 pg/mL (ref 180–914)

## 2022-10-28 MED ORDER — SODIUM BICARBONATE 650 MG PO TABS: 1300.0000 mg | ORAL_TABLET | Freq: Two times a day (BID) | ORAL | 0 refills | Status: AC

## 2022-10-28 MED ORDER — BASAGLAR KWIKPEN 100 UNIT/ML ~~LOC~~ SOPN: 30.0000 [IU] | PEN_INJECTOR | SUBCUTANEOUS | Status: AC

## 2022-10-28 MED ORDER — FERROUS SULFATE 325 (65 FE) MG PO TABS
325.0000 mg | ORAL_TABLET | Freq: Every day | ORAL | Status: DC
  Administered 2022-10-28: 325 mg via ORAL
  Filled 2022-10-28: qty 1

## 2022-10-28 MED ORDER — TAMSULOSIN HCL 0.4 MG PO CAPS: 0.4000 mg | ORAL_CAPSULE | Freq: Every day | ORAL | 0 refills | Status: DC

## 2022-10-28 NOTE — Evaluation (Signed)
Physical Therapy Evaluation Patient Details Name: Alexander Cunningham MRN: [id number] DOB: 1950/10/27 Today's Date: 10/28/2022  History of Present Illness  Alexander Cunningham is a 72 y.o. male reports being in his usual state of health till about 2 days ago.  Before that patient reports he had no trouble getting up walking around eating drinking etc.  No shortness of breath.  For the last 2 days patient reports new onset of shortness of breath that is present pretty much all the time and also worse with ambulation.  Patient denies any fever chest pain presyncope palpitation or falling.  No leg swelling or cramping.  Patient also reports 2 days of nausea and very poor appetite, especially in the last 24 hours.  Patient also reports not making urine and then in the last 24 hours. Covid +   Clinical Impression  Patient received up in recliner. States he is going home today. Has foley catheter present. Patient is supervision for sit to stand, ambulated in room without AD ~25 feet with supervision/cga. No lob. Decreased cadence. He is safe to return home at this time with wife. No further acute needs.         Recommendations for follow up therapy are one component of a multi-disciplinary discharge planning process, led by the attending physician.  Recommendations may be updated based on patient status, additional functional criteria and insurance authorization.  Follow Up Recommendations No PT follow up      Assistance Recommended at Discharge PRN  Patient can return home with the following       Equipment Recommendations None recommended by PT  Recommendations for Other Services       Functional Status Assessment Patient has not had a recent decline in their functional status     Precautions / Restrictions Precautions Precautions: Fall Restrictions Weight Bearing Restrictions: No      Mobility  Bed Mobility               General bed mobility comments: Not assessed, patient up in  recliner    Transfers Overall transfer level: Modified independent Equipment used: None                    Ambulation/Gait Ambulation/Gait assistance: Min guard Gait Distance (Feet): 25 Feet Assistive device: None Gait Pattern/deviations: Step-through pattern, Decreased step length - right, Decreased step length - left, Decreased stride length Gait velocity: decr     General Gait Details: ambulated in room with min guard/supervision no AD  Stairs            Wheelchair Mobility    Modified Rankin (Stroke Patients Only)       Balance Overall balance assessment: Mild deficits observed, not formally tested                                           Pertinent Vitals/Pain Pain Assessment Pain Assessment: No/denies pain    Home Living Family/patient expects to be discharged to:: Private residence Living Arrangements: Spouse/significant other Available Help at Discharge: Family;Available 24 hours/day Type of Home: House Home Access: Stairs to enter Entrance Stairs-Rails: None Entrance Stairs-Number of Steps: 4   Home Layout: One level Home Equipment: None      Prior Function Prior Level of Function : Independent/Modified Independent  Hand Dominance        Extremity/Trunk Assessment   Upper Extremity Assessment Upper Extremity Assessment: Overall WFL for tasks assessed    Lower Extremity Assessment Lower Extremity Assessment: Overall WFL for tasks assessed    Cervical / Trunk Assessment Cervical / Trunk Assessment: Normal  Communication   Communication: HOH  Cognition Arousal/Alertness: Awake/alert Behavior During Therapy: WFL for tasks assessed/performed Overall Cognitive Status: Within Functional Limits for tasks assessed                                          General Comments      Exercises     Assessment/Plan    PT Assessment Patient does not need any further PT  services  PT Problem List Decreased strength;Decreased mobility       PT Treatment Interventions      PT Goals (Current goals can be found in the Care Plan section)  Acute Rehab PT Goals Patient Stated Goal: to go home today PT Goal Formulation: With patient Time For Goal Achievement: 10/29/22 Potential to Achieve Goals: Good    Frequency       Co-evaluation               AM-PAC PT "6 Clicks" Mobility  Outcome Measure Help needed turning from your back to your side while in a flat bed without using bedrails?: None Help needed moving from lying on your back to sitting on the side of a flat bed without using bedrails?: None Help needed moving to and from a bed to a chair (including a wheelchair)?: None Help needed standing up from a chair using your arms (e.g., wheelchair or bedside chair)?: None Help needed to walk in hospital room?: None Help needed climbing 3-5 steps with a railing? : A Little 6 Click Score: 23    End of Session   Activity Tolerance: Patient tolerated treatment well Patient left: in chair;with call bell/phone within reach Nurse Communication: Mobility status PT Visit Diagnosis: Muscle weakness (generalized) (M62.81)    Time: 0258-5277 PT Time Calculation (min) (ACUTE ONLY): 10 min   Charges:   PT Evaluation $PT Eval Low Complexity: 1 Low          Rhythm Gubbels, PT, GCS 10/28/22,10:01 AM

## 2022-10-28 NOTE — Plan of Care (Signed)
Patient discharged per MD orders at this time.All dc instructions,education and medications reviewed with the patient.Pt expressed understanding and will comply with dc instructions. F/u appointments was also communicated to the patient.no verbal c/o or any ssx of distress. Pt was discharged home with self-care per order.Pt was transported home by spouse in a privately owned vehicle.

## 2022-10-28 NOTE — Plan of Care (Signed)

## 2022-10-28 NOTE — Discharge Summary (Signed)
Physician Discharge Summary   Patient: Alexander Cunningham MRN: [id number] DOB: 12/30/1950  Admit date:     10/25/2022  Discharge date: 10/28/22  Discharge Physician: Marrion Coy   PCP: Dorothey Baseman, MD   Recommendations at discharge:   Follow-up with PCP in 1 week, check a BMP at the next office visit. Follow-up with urology in 2 weeks, keep Foley catheter until seen by urology. Follow-up with nephrology in 2 weeks.  Discharge Diagnoses: Principal Problem:   Hyperkalemia Active Problems:   Uncontrolled type 2 diabetes mellitus with hyperglycemia, with long-term current use of insulin (HCC)   Acute renal failure superimposed on stage 3a chronic kidney disease (HCC)   Anuria   Shortness of breath   COVID-19 virus infection   Leukocytosis   Metabolic acidosis   Urinary retention due to benign prostatic hyperplasia   Hypomagnesemia   Acute renal failure (ARF) (HCC)   Thrombocytopenia (HCC)  Resolved Problems:   * No resolved hospital problems. * Hyponatremia. Hypomagnesemia Leukocytosis Anuria. Hospital Course: Alexander Cunningham is a 72 y.o. male reports being in his usual state of health till about 2 days ago.  Has significant fatigue, he has not been eating for the last 2 days due to lack of appetite.  He also has some short of breath without a cough. Upon arriving emergency room, he was found to be COVID-positive.  He had a significant renal failure with creatinine of 8.76, potassium 6.9, CO2 13.  Patient was placed on bicarb drip.  Patient was also given remdesivir for COVID infection.  He has not been requiring oxygen.  Chest x-ray did not show any evidence of pneumonia. Bladder scan showed residual over 500, Foley catheter was anchored.  Patient was given IV fluids, renal function has improved, creatinine level dropped down to 1.96.  Currently patient is medically stable to be discharged. Patient did not have bacterial pneumonia, no need for antibiotics.  Assessment and  Plan:  Hyperkalemia Acute renal failure superimposed on stage 3a chronic kidney disease (HCC) Metabolic acidosis Urinary retention secondary to benign prostate hypertrophy. Patient developed acute renal failure with a severe elevation of her creatinine associated with severe hyperkalemia and metabolic acidosis.  He received a bicarb drip, renal function is slowly improving.  Foley catheter was also anchored, he had a residual of 500 mL, since that time he has been making urine. Patient condition has improved today, creatinine level dropped down to 1.94, CO2 21.  Will continue sodium bicarbonate for few days. Patient will be continued on Flomax.  Keep a Foley catheter, he will be followed by urology in 2 weeks to remove Foley catheter.   Hypomagnesemia. Potassium level has normalized.   Uncontrolled type 2 diabetes with hyperglycemia. Resume home insulin, Lantus dose was decreased to 30 units due to acute renal failure.  Follow-up with PCP to titrate back.   COVID-19 virus infection Thrombocytopenia. Still has no hypoxemia, prior x-ray has no pneumonia.  No short of breath.  Completed 1 dose of remdesivir.  Patient lost IV access, second dose was not given.  However, patient does not have any respiratory symptoms, no pneumonia.  No additional treatment is needed. Thrombocytopenia was secondary to COVID, condition improving.   Iron deficient anemia. Hemoglobin is stable, no further drop of hemoglobin.  Continue iron supplement.       Consultants: Nephrology Procedures performed: None  Disposition: Home Diet recommendation:  Discharge Diet Orders (From admission, onward)     Start     Ordered   10/28/22  0000  Diet - low sodium heart healthy        10/28/22 0959           Cardiac diet DISCHARGE MEDICATION: Allergies as of 10/28/2022       Reactions   Contrast Media [iodinated Contrast Media] Rash   Other Rash   Pt states the bed sheets at the hospital gave him a bad  purple rash on his back.   Oxycodone Hives   Tricor [fenofibrate] Rash        Medication List     STOP taking these medications    lisinopril 5 MG tablet Commonly known as: ZESTRIL       TAKE these medications    Basaglar KwikPen 100 UNIT/ML Inject 30 Units into the skin See admin instructions. Inject 50u under the skin every morning and inject 40u under the skin every night What changed: how much to take   ferrous sulfate 325 (65 FE) MG tablet Take 325 mg by mouth every morning.   gabapentin 300 MG capsule Commonly known as: NEURONTIN Take 300 mg by mouth 3 (three) times daily.   insulin aspart 100 UNIT/ML FlexPen Commonly known as: NOVOLOG Inject 15 Units into the skin in the morning, at noon, and at bedtime.   magnesium oxide 400 MG tablet Commonly known as: MAG-OX Take 1 tablet by mouth 2 (two) times daily.   omeprazole 40 MG capsule Commonly known as: PRILOSEC Take 40 mg by mouth daily.   propranolol 10 MG tablet Commonly known as: INDERAL Take 10 mg by mouth 2 (two) times daily.   sodium bicarbonate 650 MG tablet Take 2 tablets (1,300 mg total) by mouth 2 (two) times daily for 5 days.   tamsulosin 0.4 MG Caps capsule Commonly known as: FLOMAX Take 1 capsule (0.4 mg total) by mouth daily. Start taking on: October 29, 2022        Follow-up Information     Juluis Pitch, MD Follow up in 1 week(s).   Specialty: Family Medicine Contact information: [address] [address] [phone]         Anthonette Legato, MD Follow up in 2 week(s).   Specialty: Nephrology Contact information: [place] [address] [phone]         Abbie Sons, MD Follow up in 2 week(s).   Specialty: Urology Contact information: [place] [address] [phone]                Discharge Exam: Danley Danker Weights   10/25/22 1845 10/26/22 0825  Weight: 78 kg 78 kg   General  exam: Appears calm and comfortable  Respiratory system: Clear to auscultation. Respiratory effort normal. Cardiovascular system: S1 & S2 heard, RRR. No JVD, murmurs, rubs, gallops or clicks. No pedal edema. Gastrointestinal system: Abdomen is nondistended, soft and nontender. No organomegaly or masses felt. Normal bowel sounds heard. Central nervous system: Alert and oriented. No focal neurological deficits. Extremities: Symmetric 5 x 5 power. Skin: No rashes, lesions or ulcers Psychiatry: Judgement and insight appear normal. Mood & affect appropriate.    Condition at discharge: good  The results of significant diagnostics from this hospitalization (including imaging, microbiology, ancillary and laboratory) are listed below for reference.   Imaging Studies: US RENAL  Result Date: 10/26/2022 CLINICAL DATA:  Anuria EXAM: RENAL / URINARY TRACT ULTRASOUND COMPLETE COMPARISON:  None Available. FINDINGS: Right Kidney: Renal measurements: 10.0 x 4.8 x 4.7 cm =  volume: 117 mL. Echogenicity within normal limits. No mass or hydronephrosis visualized. Left Kidney: Renal measurements: 12.3 x 2.2 x 5.1 cm = volume: 204 mL. Echogenicity within normal limits. No mass or hydronephrosis visualized. 14 mm simple cyst noted within the upper pole the left kidney for which no follow-up imaging is recommended. Bladder: Decompressed with Foley catheter balloon seen Other: None. IMPRESSION: 1. Normal renal sonogram. Electronically Signed   By: Fidela Salisbury M.D.   On: 10/26/2022 03:03   DG Chest Portable 1 View  Result Date: 10/25/2022 CLINICAL DATA:  Weakness EXAM: PORTABLE CHEST 1 VIEW COMPARISON:  Chest x-ray 06/09/2021 FINDINGS: The heart size and mediastinal contours are within normal limits. There is some scattered chronic appearing interstitial opacities in the left lower lung. There is no focal lung infiltrate. Costophrenic angles are clear. No pneumothorax. The cardiomediastinal silhouette is within normal  limits. The visualized skeletal structures are unremarkable. IMPRESSION: No active disease. Chronic appearing interstitial opacities in the left lower lung. Electronically Signed   By: Ronney Asters M.D.   On: 10/25/2022 20:16    Microbiology: Results for orders placed or performed during the hospital encounter of 10/25/22  Culture, blood (Routine X 2) w Reflex to ID Panel     Status: None (Preliminary result)   Collection Time: 10/25/22 11:54 PM   Specimen: BLOOD  Result Value Ref Range Status   Specimen Description BLOOD LEFT [facility]  Final   Special Requests   Final    BOTTLES DRAWN AEROBIC AND ANAEROBIC Blood Culture adequate volume   Culture   Final    NO GROWTH 2 DAYS Performed at [facility], [address] [place], [place] [address]    Report Status PENDING  Incomplete  Culture, blood (Routine X 2) w Reflex to ID Panel     Status: None (Preliminary result)   Collection Time: 10/26/22 12:07 AM   Specimen: BLOOD  Result Value Ref Range Status   Specimen Description BLOOD RIGHT [facility]  Final   Special Requests   Final    BOTTLES DRAWN AEROBIC AND ANAEROBIC Blood Culture adequate volume   Culture   Final    NO GROWTH 2 DAYS Performed at [facility], [address] [place], [place] [address]    Report Status PENDING  Incomplete    Labs: CBC: Recent Labs  Lab 10/25/22 1855 10/26/22 0627 10/27/22 0400 10/28/22 0430  WBC 13.9* 6.1 5.0 4.3  HGB 15.2 12.0* 9.7* 9.9*  HCT 46.8 35.2* 28.6* 29.5*  MCV 95.7 92.4 92.6 93.4  PLT 362 129* 107* 169*   Basic Metabolic Panel: Recent Labs  Lab 10/25/22 1855 10/26/22 0627 10/27/22 0400 10/28/22 0430  NA 133* 139 140 139  K 6.9* 4.0 4.1 3.7  CL 105 103 108 107  CO2 13* 20* 19* 21*  GLUCOSE 199* 331* 203* 186*  BUN 99* 139* 127* 94*  CREATININE 8.76* 6.16* 3.66* 1.94*  CALCIUM 9.6 8.3* 7.9* 8.4*  MG 2.1  --  1.4* 2.1   Liver Function Tests: Recent Labs  Lab 10/25/22 1855 10/26/22 0627  AST 31 40  ALT  34 28  ALKPHOS 57 41  BILITOT 1.2 0.6  PROT 8.8* 6.6  ALBUMIN 4.2 3.1*   CBG: Recent Labs  Lab 10/27/22 0750 10/27/22 1139 10/27/22 1708 10/27/22 2127 10/28/22 0847  GLUCAP 184* 226* 266* 257* 161*    Discharge time spent: greater than 30 minutes.  Signed: Sharen Hones, MD [facility] 10/28/2022

## 2022-10-31 LAB — CULTURE, BLOOD (ROUTINE X 2)
Culture: NO GROWTH
Culture: NO GROWTH
Special Requests: ADEQUATE
Special Requests: ADEQUATE

## 2022-11-12 ENCOUNTER — Ambulatory Visit: Payer: Medicare Other | Admitting: Physician Assistant

## 2022-11-12 ENCOUNTER — Ambulatory Visit: Payer: Medicare Other | Admitting: Urology

## 2022-11-12 ENCOUNTER — Encounter: Payer: Self-pay | Admitting: Urology

## 2022-11-12 VITALS — BP 95/60 | HR 59 | Ht 70.0 in | Wt 172.0 lb

## 2022-11-12 DIAGNOSIS — N138 Other obstructive and reflux uropathy: Secondary | ICD-10-CM

## 2022-11-12 DIAGNOSIS — Z466 Encounter for fitting and adjustment of urinary device: Secondary | ICD-10-CM

## 2022-11-12 LAB — BLADDER SCAN AMB NON-IMAGING: Scan Result: 0

## 2022-11-12 MED ORDER — CEPHALEXIN 250 MG PO CAPS
500.0000 mg | ORAL_CAPSULE | Freq: Once | ORAL | Status: AC
  Administered 2022-11-12: 500 mg via ORAL

## 2022-11-12 MED ORDER — TAMSULOSIN HCL 0.4 MG PO CAPS: 0.4000 mg | ORAL_CAPSULE | Freq: Every day | ORAL | 11 refills | Status: AC

## 2022-11-12 NOTE — Progress Notes (Unsigned)
Afternoon follow-up  Patient returned to clinic this afternoon for repeat PVR. He reports drinking approximately 30oz of fluid. He has been able to urinate. PVR 50m.  Results for orders placed or performed in visit on 11/12/22  Bladder Scan (Post Void Residual) in office  Result Value Ref Range   Scan Result 0     Voiding trial passed. Patient was rather agitated this afternoon in recounting his [facility]; redirection was challenging. I recommended a follow-up visit with Dr. SDiamantina Providencein 4 weeks for repeat PVR but ultimately he declined this and stated [facility]   I encouraged him to take Flomax as prescribed by Dr. SDiamantina Providenceand will [facility]; we discussed return precautions including the inability to void and lower abominal pain/distention. [facility].  SDebroah Loop PA-C 11/12/2022 03:30pm  Follow up: Return if symptoms worsen or fail to improve.

## 2022-11-12 NOTE — Progress Notes (Unsigned)
11/12/22 10:37 AM   Alexander Cunningham Apr 23, 1951 [id number]  CC: Urinary retention, renal failure  HPI: Comorbid 72 year old male with history of CHF, diabetes(recent hemoglobin A1c 8.0, and cirrhosis who was recently hospitalized for COVID as well as acute renal failure with creatinine of 7.9, EGFR of 7 from baseline renal function of creatinine 1.4, EGFR 50 from September 2023.  Hospital notes reviewed extensively.  Unclear if his renal failure was from hypotension/COVID versus true obstruction, as no imaging was performed until after a catheter was placed for PVR 531m in the ER.  He denies any urinary symptoms prior to that hospitalization.  He [place].  He is a very challenging historian.  He was started on Flomax prior to discharge.  He denies any gross hematuria or dysuria prior to that hospitalization.  [facility] and a catheter that was irrigated free, but none of those notes are available to me today.  MRI of the abdomen from September 2023 for follow-up of cirrhosis showed no hydronephrosis or ascites, renal ultrasound performed at time of hospitalization with catheter and showed no hydronephrosis and decompressed bladder around Foley catheter.   PMH: Past Medical History:  Diagnosis Date   CH[place] (congestive heart failure) [place]    Diabetes mellitus without complication (HCC)    GERD (gastroesophageal reflux disease)    Ulcerative colitis ([place]     Surgical History: Past Surgical History:  Procedure Laterality Date   COLON SURGERY     ESOPHAGOGASTRODUODENOSCOPY N/A 08/17/2021   Procedure: ESOPHAGOGASTRODUODENOSCOPY (EGD);  Surgeon: RAnnamaria Helling DO;  [facility];  Service: Gastroenterology;  Laterality: N/A;  IDDM   ESOPHAGOGASTRODUODENOSCOPY N/A 08/30/2022   Procedure: ESOPHAGOGASTRODUODENOSCOPY  (EGD);  Surgeon: RAnnamaria Helling DO;  [facility];  Service: Gastroenterology;  Laterality: N/A;   HERNIA REPAIR      Social History:  reports that he has quit smoking. His smoking use included cigarettes. He has been exposed to tobacco smoke. He has never used smokeless tobacco. He reports current alcohol use. He reports that he does not use drugs.  Physical Exam: BP 95/60   Pulse (!) 59   Ht 5' 10"$  (1.778 m)   Wt 172 lb (78 kg)   BMI 24.68 kg/m    Constitutional:  Alert and oriented, No acute distress. Cardiovascular: No clubbing, cyanosis, or edema. Respiratory: Normal respiratory effort, no increased work of breathing. GI: Abdomen is soft, nontender, nondistended, no abdominal masses   Laboratory Data: Reviewed, see HPI  Pertinent Imaging: I have personally viewed and interpreted the MRI from September 2023 showing no hydronephrosis, as well as the renal ultrasound from January with no hydronephrosis and Foley catheter in place.  CT from June 2023 shows no hydronephrosis, prostate measured 60 g  Assessment & Plan:   72year old male recently hospitalized for COVID and hypotension, AKI/renal failure, catheter was placed for a reported PVR of 500 mL in the ER.  He denied any urinary symptoms, and no hydronephrosis was seen on his CT from June 2023, MRI from September 2023, or the renal ultrasound in January after Foley placement.  Unclear if renal failure was from COVID and hypotension or true retention with obstruction and upstream hydronephrosis.  I recommended a voiding trial today, and catheter was removed and he was given Keflex for prophylaxis.  He returned this  afternoon and has been able to void with a normal PVR of 40m.  He met with our PA this afternoon and was recommended to continue Flomax with close follow-up in 4 weeks for repeat PVR, but apparently he was very confrontational and unhappy with his original care during his hospitalization(urology was  not consulted at that time) and [facility]   BNickolas Madrid MD 11/12/2022  [facility] [address] [place] [phone]

## 2022-11-21 ENCOUNTER — Ambulatory Visit: Payer: Medicare Other | Admitting: Urology

## 2022-12-12 ENCOUNTER — Ambulatory Visit: Payer: Medicare Other | Admitting: Urology

## 2022-12-18 ENCOUNTER — Emergency Department: Payer: Medicare Other

## 2022-12-18 ENCOUNTER — Inpatient Hospital Stay
Admission: EM | Admit: 2022-12-18 | Discharge: 2022-12-22 | DRG: 812 | Disposition: A | Discharge status: Home / Self Care | Payer: Medicare Other | Attending: Internal Medicine | Admitting: Internal Medicine

## 2022-12-18 DIAGNOSIS — N1831 Chronic kidney disease, stage 3a: Secondary | ICD-10-CM | POA: Diagnosis present

## 2022-12-18 DIAGNOSIS — Z91048 Other nonmedicinal substance allergy status: Secondary | ICD-10-CM

## 2022-12-18 DIAGNOSIS — Z794 Long term (current) use of insulin: Secondary | ICD-10-CM

## 2022-12-18 DIAGNOSIS — Z87891 Personal history of nicotine dependence: Secondary | ICD-10-CM

## 2022-12-18 DIAGNOSIS — Z932 Ileostomy status: Secondary | ICD-10-CM

## 2022-12-18 DIAGNOSIS — Z885 Allergy status to narcotic agent status: Secondary | ICD-10-CM

## 2022-12-18 DIAGNOSIS — D62 Acute posthemorrhagic anemia: Principal | ICD-10-CM

## 2022-12-18 DIAGNOSIS — E1122 Type 2 diabetes mellitus with diabetic chronic kidney disease: Secondary | ICD-10-CM | POA: Diagnosis present

## 2022-12-18 DIAGNOSIS — Z79899 Other long term (current) drug therapy: Secondary | ICD-10-CM

## 2022-12-18 DIAGNOSIS — Z888 Allergy status to other drugs, medicaments and biological substances status: Secondary | ICD-10-CM

## 2022-12-18 DIAGNOSIS — E1165 Type 2 diabetes mellitus with hyperglycemia: Secondary | ICD-10-CM | POA: Diagnosis present

## 2022-12-18 DIAGNOSIS — D500 Iron deficiency anemia secondary to blood loss (chronic): Secondary | ICD-10-CM | POA: Diagnosis present

## 2022-12-18 DIAGNOSIS — Z9049 Acquired absence of other specified parts of digestive tract: Secondary | ICD-10-CM

## 2022-12-18 DIAGNOSIS — K7030 Alcoholic cirrhosis of liver without ascites: Secondary | ICD-10-CM | POA: Diagnosis present

## 2022-12-18 DIAGNOSIS — D696 Thrombocytopenia, unspecified: Secondary | ICD-10-CM | POA: Diagnosis present

## 2022-12-18 DIAGNOSIS — K9401 Colostomy hemorrhage: Secondary | ICD-10-CM

## 2022-12-18 DIAGNOSIS — I8510 Secondary esophageal varices without bleeding: Secondary | ICD-10-CM | POA: Diagnosis present

## 2022-12-18 DIAGNOSIS — R161 Splenomegaly, not elsewhere classified: Secondary | ICD-10-CM | POA: Diagnosis present

## 2022-12-18 DIAGNOSIS — K3189 Other diseases of stomach and duodenum: Secondary | ICD-10-CM | POA: Diagnosis present

## 2022-12-18 DIAGNOSIS — K5190 Ulcerative colitis, unspecified, without complications: Secondary | ICD-10-CM | POA: Diagnosis present

## 2022-12-18 DIAGNOSIS — K766 Portal hypertension: Secondary | ICD-10-CM | POA: Diagnosis present

## 2022-12-18 DIAGNOSIS — Z91041 Radiographic dye allergy status: Secondary | ICD-10-CM

## 2022-12-18 DIAGNOSIS — I959 Hypotension, unspecified: Secondary | ICD-10-CM | POA: Diagnosis present

## 2022-12-18 DIAGNOSIS — E872 Acidosis, unspecified: Secondary | ICD-10-CM | POA: Diagnosis present

## 2022-12-18 DIAGNOSIS — K9411 Enterostomy hemorrhage: Secondary | ICD-10-CM | POA: Diagnosis present

## 2022-12-18 DIAGNOSIS — I129 Hypertensive chronic kidney disease with stage 1 through stage 4 chronic kidney disease, or unspecified chronic kidney disease: Secondary | ICD-10-CM | POA: Diagnosis present

## 2022-12-18 DIAGNOSIS — I10 Essential (primary) hypertension: Secondary | ICD-10-CM | POA: Diagnosis present

## 2022-12-18 DIAGNOSIS — K219 Gastro-esophageal reflux disease without esophagitis: Secondary | ICD-10-CM | POA: Diagnosis present

## 2022-12-18 DIAGNOSIS — D6959 Other secondary thrombocytopenia: Secondary | ICD-10-CM | POA: Diagnosis present

## 2022-12-18 HISTORY — DX: Esophageal varices without bleeding: I85.00

## 2022-12-18 HISTORY — DX: Portal hypertension: K76.6

## 2022-12-18 HISTORY — DX: Unspecified cirrhosis of liver: K74.60

## 2022-12-18 LAB — COMPREHENSIVE METABOLIC PANEL
ALT: 18 U/L (ref 0–44)
AST: 29 U/L (ref 15–41)
Albumin: 2.6 g/dL — ABNORMAL LOW (ref 3.5–5.0)
Alkaline Phosphatase: 80 U/L (ref 38–126)
Anion gap: 3 — ABNORMAL LOW (ref 5–15)
BUN: 28 mg/dL — ABNORMAL HIGH (ref 8–23)
CO2: 17 mmol/L — ABNORMAL LOW (ref 22–32)
Calcium: 8.2 mg/dL — ABNORMAL LOW (ref 8.9–10.3)
Chloride: 117 mmol/L — ABNORMAL HIGH (ref 98–111)
Creatinine, Ser: 1.61 mg/dL — ABNORMAL HIGH (ref 0.61–1.24)
GFR, Estimated: 45 mL/min — ABNORMAL LOW (ref >60)
Glucose, Bld: 290 mg/dL — ABNORMAL HIGH (ref 70–99)
Potassium: 5 mmol/L (ref 3.5–5.1)
Sodium: 137 mmol/L (ref 135–145)
Total Bilirubin: 0.6 mg/dL (ref 0.3–1.2)
Total Protein: 5 g/dL — ABNORMAL LOW (ref 6.5–8.1)

## 2022-12-18 LAB — CBC WITH DIFFERENTIAL/PLATELET
Abs Immature Granulocytes: 0.02 10*3/uL (ref 0.00–0.07)
Basophils Absolute: 0.1 10*3/uL (ref 0.0–0.1)
Basophils Relative: 1 %
Eosinophils Absolute: 0.3 10*3/uL (ref 0.0–0.5)
Eosinophils Relative: 5 %
HCT: 25.5 % — ABNORMAL LOW (ref 39.0–52.0)
Hemoglobin: 8.3 g/dL — ABNORMAL LOW (ref 13.0–17.0)
Immature Granulocytes: 0 %
Lymphocytes Relative: 37 %
Lymphs Abs: 2.5 10*3/uL (ref 0.7–4.0)
MCH: 31.7 pg (ref 26.0–34.0)
MCHC: 32.5 g/dL (ref 30.0–36.0)
MCV: 97.3 fL (ref 80.0–100.0)
Monocytes Absolute: 0.4 10*3/uL (ref 0.1–1.0)
Monocytes Relative: 6 %
Neutro Abs: 3.4 10*3/uL (ref 1.7–7.7)
Neutrophils Relative %: 51 %
Platelets: 130 10*3/uL — ABNORMAL LOW (ref 150–400)
RBC: 2.62 MIL/uL — ABNORMAL LOW (ref 4.22–5.81)
RDW: 13.5 % (ref 11.5–15.5)
WBC: 6.8 10*3/uL (ref 4.0–10.5)
nRBC: 0 % (ref 0.0–0.2)

## 2022-12-18 LAB — TYPE AND SCREEN

## 2022-12-18 LAB — PROTIME-INR
INR: 1.6 — ABNORMAL HIGH (ref 0.8–1.2)
Prothrombin Time: 18.6 seconds — ABNORMAL HIGH (ref 11.4–15.2)

## 2022-12-18 LAB — LACTIC ACID, PLASMA: Lactic Acid, Venous: 1.5 mmol/L (ref 0.5–1.9)

## 2022-12-18 MED ORDER — ACETAMINOPHEN 325 MG PO TABS
650.0000 mg | ORAL_TABLET | Freq: Four times a day (QID) | ORAL | Status: DC | PRN
  Administered 2022-12-19: 650 mg via ORAL
  Filled 2022-12-18: qty 2

## 2022-12-18 MED ORDER — SILVER NITRATE-POT NITRATE 75-25 % EX MISC
1.0000 | Freq: Once | CUTANEOUS | Status: AC
  Administered 2022-12-19: 1 via TOPICAL
  Filled 2022-12-18: qty 10

## 2022-12-18 MED ORDER — TAMSULOSIN HCL 0.4 MG PO CAPS
0.4000 mg | ORAL_CAPSULE | Freq: Every day | ORAL | Status: DC
  Administered 2022-12-19 – 2022-12-22 (×4): 0.4 mg via ORAL
  Filled 2022-12-18 (×4): qty 1

## 2022-12-18 MED ORDER — ONDANSETRON HCL 4 MG PO TABS: 4.0000 mg | ORAL_TABLET | Freq: Four times a day (QID) | ORAL | Status: DC | PRN

## 2022-12-18 MED ORDER — SODIUM CHLORIDE 0.9 % IV BOLUS
1000.0000 mL | Freq: Once | INTRAVENOUS | Status: AC
  Administered 2022-12-18: 1000 mL via INTRAVENOUS

## 2022-12-18 MED ORDER — SODIUM CHLORIDE 0.9 % IV SOLN: INTRAVENOUS | Status: AC

## 2022-12-18 MED ORDER — ACETAMINOPHEN 650 MG RE SUPP: 650.0000 mg | Freq: Four times a day (QID) | RECTAL | Status: DC | PRN

## 2022-12-18 MED ORDER — PANTOPRAZOLE SODIUM 40 MG PO TBEC
40.0000 mg | DELAYED_RELEASE_TABLET | Freq: Every day | ORAL | Status: DC
  Administered 2022-12-19 – 2022-12-22 (×4): 40 mg via ORAL
  Filled 2022-12-18 (×4): qty 1

## 2022-12-18 MED ORDER — INSULIN ASPART 100 UNIT/ML IJ SOLN
0.0000 [IU] | Freq: Every day | INTRAMUSCULAR | Status: DC
  Administered 2022-12-19 – 2022-12-21 (×3): 2 [IU] via SUBCUTANEOUS
  Filled 2022-12-18 (×3): qty 1

## 2022-12-18 MED ORDER — INSULIN ASPART 100 UNIT/ML IJ SOLN
0.0000 [IU] | Freq: Three times a day (TID) | INTRAMUSCULAR | Status: DC
  Administered 2022-12-19: 5 [IU] via SUBCUTANEOUS
  Administered 2022-12-19: 3 [IU] via SUBCUTANEOUS
  Administered 2022-12-19: 2 [IU] via SUBCUTANEOUS
  Administered 2022-12-21: 8 [IU] via SUBCUTANEOUS
  Administered 2022-12-21 – 2022-12-22 (×3): 3 [IU] via SUBCUTANEOUS
  Filled 2022-12-18 (×7): qty 1

## 2022-12-18 MED ORDER — ONDANSETRON HCL 4 MG/2ML IJ SOLN: 4.0000 mg | Freq: Four times a day (QID) | INTRAMUSCULAR | Status: DC | PRN

## 2022-12-18 NOTE — Assessment & Plan Note (Signed)
Chronic Platelets 130,000, up from 94,000 the day prior Continue to monitor

## 2022-12-18 NOTE — Assessment & Plan Note (Signed)
Blood sugar 290 Continue basal insulin Sliding scale insulin coverage

## 2022-12-18 NOTE — ED Triage Notes (Signed)
Pt brought in via ems to room 12 w c/o bleeding around colostomy bag tonight. Pt reports changing his bag x 2. VVS on arrival. A&Ox4

## 2022-12-18 NOTE — Assessment & Plan Note (Signed)
Expecting improvement with IV hydration

## 2022-12-18 NOTE — H&P (Signed)
History and Physical    Patient: Alexander Cunningham DOB: [birth year] DOA: 12/18/2022 DOS: the patient was seen and examined on 12/18/2022 PCP: Juluis Pitch, MD  Patient coming from: Home  Chief Complaint:  Chief Complaint  Patient presents with   GI Problem    HPI: Alexander Cunningham is a 72 y.o. male with medical history significant for diabetes type 2, hypertension, ulcerative colitis s/pcolectomy/ ileostomy right lower quadrant  since 2016 who presents to the ED with recurrent bleeding around ostomy site, now associated with lightheadedness.  Patient was seen in [facility] ED [facility] the day prior on 3/18 when he presented after noticing blood squirting from around his ostomy site after taking a shower.  He reported a similar episode about 4 years prior that quiet stitches for hemostasis.  The bleeding stopped with pressure and he was discharged.  His hemoglobin at [facility] was around his baseline at 10.6.  The episode recurred today and he continued to ooze and called EMS.  On arrival of EMS systolic blood pressure was in the 50s.  He denied chest pain or shortness of breath. ED course and data Review: BP on arrival 128/59 with pulse 66 and otherwise normal vitals.  Hemoglobin 8.3, down from 10.6 the day prior.  Platelets of 130, up from 94 a day ago. Other notable labs include glucose of 290, creatinine at baseline of 1.61 but with bicarb of 17, CT abdomen and pelvis showed no acute intra-abdominal or pelvic pathology and showed postsurgical changes of total colectomy with right lower quadrant ileostomy without obstruction. The ED provider: Spoke with surgeon, Dr. Hampton Abbot who recommended applying silver nitrate around the site.  This was done from the ED.  Hospitalist consulted for admission.   Review of Systems: As mentioned in the history of present illness. All other systems reviewed and are negative.  Past Medical History:  Diagnosis Date   CHF (congestive heart failure) (HCC)     Diabetes mellitus without complication (HCC)    GERD (gastroesophageal reflux disease)    Ulcerative colitis ([place])    Past Surgical History:  Procedure Laterality Date   COLON SURGERY     ESOPHAGOGASTRODUODENOSCOPY N/A 08/17/2021   Procedure: ESOPHAGOGASTRODUODENOSCOPY (EGD);  Surgeon: Annamaria Helling, DO;  [facility];  Service: Gastroenterology;  Laterality: N/A;  IDDM   ESOPHAGOGASTRODUODENOSCOPY N/A 08/30/2022   Procedure: ESOPHAGOGASTRODUODENOSCOPY (EGD);  Surgeon: Annamaria Helling, DO;  [facility]COPY;  Service: Gastroenterology;  Laterality: N/A;   HERNIA REPAIR     Social History:  reports that he has quit smoking. His smoking use included cigarettes. He has been exposed to tobacco smoke. He has never used smokeless tobacco. He reports current alcohol use. He reports that he does not use drugs.  Allergies  Allergen Reactions   Contrast Media [Iodinated Contrast Media] Rash   Other Rash    Pt states the bed sheets at the hospital gave him a bad purple rash on his back.   Oxycodone Hives   Tricor [Fenofibrate] Rash    No family history on file.  Prior to Admission medications   Medication Sig Start Date End Date Taking? Authorizing Provider  ferrous sulfate 325 (65 FE) MG tablet Take 325 mg by mouth every morning. 01/04/21   [provider]  gabapentin (NEURONTIN) 300 MG capsule Take 300 mg by mouth 3 (three) times daily. 05/16/21   [provider]  insulin aspart (NOVOLOG) 100 UNIT/ML FlexPen Inject 15 Units into the skin in the morning, at noon,  and at bedtime.    [provider]  Insulin Glargine (BASAGLAR KWIKPEN) 100 UNIT/ML Inject 30 Units into the skin See admin instructions. Inject 50u under the skin every morning and inject 40u under the skin every night 10/28/22   Sharen Hones, MD  lisinopril (ZESTRIL) 5 MG tablet Take by mouth. 11/10/22   [provider]  magnesium oxide (MAG-OX) 400 MG tablet Take 1  tablet by mouth 2 (two) times daily. 05/03/21   [provider]  omeprazole (PRILOSEC) 40 MG capsule Take 40 mg by mouth daily. 05/26/21   [provider]  propranolol (INDERAL) 10 MG tablet Take 10 mg by mouth 2 (two) times daily. 05/14/21   [provider]  tamsulosin (FLOMAX) 0.4 MG CAPS capsule Take 1 capsule (0.4 mg total) by mouth daily. 11/12/22   Billey Co, MD    Physical Exam: Vitals:   12/18/22 2204 12/18/22 2206  BP: (!) 128/59   Pulse: 66   Resp: 18   Temp: (!) 97.5 F (36.4 C)   TempSrc: Oral   SpO2: 100%   Weight:  93.4 kg  Height:  5\' 10"  (1.778 m)   Physical Exam Vitals and nursing note reviewed.  Constitutional:      General: He is not in acute distress. HENT:     Head: Normocephalic and atraumatic.  Cardiovascular:     Rate and Rhythm: Normal rate and regular rhythm.     Heart sounds: Normal heart sounds.  Pulmonary:     Effort: Pulmonary effort is normal.     Breath sounds: Normal breath sounds.  Abdominal:     Palpations: Abdomen is soft.     Tenderness: There is no abdominal tenderness.     Comments: No active bleeding at site in RLQ ileostomy site.   Neurological:     Mental Status: Mental status is at baseline.     Labs on Admission: I have personally reviewed following labs and imaging studies  CBC: Recent Labs  Lab 12/18/22 2220  WBC 6.8  NEUTROABS 3.4  HGB 8.3*  HCT 25.5*  MCV 97.3  PLT [id number]*   Basic Metabolic Panel: Recent Labs  Lab 12/18/22 2220  NA 137  K 5.0  CL 117*  CO2 17*  GLUCOSE 290*  BUN 28*  CREATININE 1.61*  CALCIUM 8.2*   GFR: Estimated Creatinine Clearance: 47.6 mL/min (A) (by C-G formula based on SCr of 1.61 mg/dL (H)). Liver Function Tests: Recent Labs  Lab 12/18/22 2220  AST 29  ALT 18  ALKPHOS 80  BILITOT 0.6  PROT 5.0*  ALBUMIN 2.6*   No results for input(s): "LIPASE", "AMYLASE" in the last 168 hours. No results for input(s): "AMMONIA" in the last 168  hours. Coagulation Profile: Recent Labs  Lab 12/18/22 2220  INR 1.6*   Cardiac Enzymes: No results for input(s): "CKTOTAL", "CKMB", "CKMBINDEX", "TROPONINI" in the last 168 hours. BNP (last 3 results) No results for input(s): "PROBNP" in the last 8760 hours. HbA1C: No results for input(s): "HGBA1C" in the last 72 hours. CBG: No results for input(s): "GLUCAP" in the last 168 hours. Lipid Profile: No results for input(s): "CHOL", "HDL", "LDLCALC", "TRIG", "CHOLHDL", "LDLDIRECT" in the last 72 hours. Thyroid Function Tests: No results for input(s): "TSH", "T4TOTAL", "FREET4", "T3FREE", "THYROIDAB" in the last 72 hours. Anemia Panel: No results for input(s): "VITAMINB12", "FOLATE", "FERRITIN", "TIBC", "IRON", "RETICCTPCT" in the last 72 hours. Urine analysis:    Component Value Date/Time   COLORURINE YELLOW (A) 10/25/2022 2354  APPEARANCEUR HAZY (A) 10/25/2022 2354   LABSPEC 1.015 10/25/2022 2354   PHURINE 5.0 10/25/2022 2354   GLUCOSEU 50 (A) 10/25/2022 2354   HGBUR MODERATE (A) 10/25/2022 2354   BILIRUBINUR NEGATIVE 10/25/2022 2354   KETONESUR NEGATIVE 10/25/2022 2354   PROTEINUR 30 (A) 10/25/2022 2354   NITRITE NEGATIVE 10/25/2022 2354   LEUKOCYTESUR NEGATIVE 10/25/2022 2354    Radiological Exams on Admission: CT ABDOMEN PELVIS WO CONTRAST  Result Date: 12/18/2022 CLINICAL DATA:  Abdominal pain.  Lower GI bleed. EXAM: CT ABDOMEN AND PELVIS WITHOUT CONTRAST TECHNIQUE: Multidetector CT imaging of the abdomen and pelvis was performed following the standard protocol without IV contrast. RADIATION DOSE REDUCTION: This exam was performed according to the departmental dose-optimization program which includes automated exposure control, adjustment of the mA and/or kV according to patient size and/or use of iterative reconstruction technique. COMPARISON:  CT abdomen pelvis 03/05/2022. FINDINGS: Evaluation of this exam is limited in the absence of intravenous contrast. Lower chest:  Diffuse chronic interstitial coarsening of the lung bases. The visualized lung bases are otherwise clear. No intra-abdominal free air or free fluid. Hepatobiliary: There is irregular appearance of the liver contour in keeping with cirrhosis. The gallbladder is not visualized, likely surgically absent. Pancreas: Unremarkable. No pancreatic ductal dilatation or surrounding inflammatory changes. Spleen: Normal in size without focal abnormality. Adrenals/Urinary Tract: The adrenal glands are unremarkable. There is no hydronephrosis or nephrolithiasis on either side. The visualized ureters appear unremarkable. The urinary bladder is collapsed. Stomach/Bowel: Postsurgical changes of total colectomy with a right lower quadrant ileostomy. There is no bowel obstruction or active inflammation. Vascular/Lymphatic: Moderate aortoiliac atherosclerotic disease. The IVC is unremarkable. No portal venous gas. There is no adenopathy. Reproductive: The prostate gland is enlarged measuring 5.3 cm in transverse axial diameter. The seminal vesicles are symmetric. Other: None Musculoskeletal: Degenerative changes of the spine. No acute osseous pathology. IMPRESSION: 1. No acute intra-abdominal or pelvic pathology. 2. Cirrhosis. 3. Postsurgical changes of total colectomy with a right lower quadrant ileostomy. No bowel obstruction. 4.  Aortic Atherosclerosis (ICD10-I70.0). Electronically Signed   By: Anner Crete M.D.   On: 12/18/2022 22:49     Data Reviewed: Relevant notes from primary care and specialist visits, past discharge summaries as available in EHR, including Care Everywhere. Prior diagnostic testing as pertinent to current admission diagnoses Updated medications and problem lists for reconciliation ED course, including vitals, labs, imaging, treatment and response to treatment Triage notes, nursing and pharmacy notes and ED provider's notes Notable results as noted in HPI   Assessment and Plan: * ABLA (acute  blood loss anemia) Recurrent bleeding from around ileostomy stoma History of colectomy/ileostomy 2016 secondary to ulcerative colitis History of bleeding from stomal site 3 years prior requiring stitches and again on 3/18 improving with compressive dressing, now recurring and symptomatic for lightheadedness Hemoglobin 8.3, down from 10.6 at [facility] on 3/18 Serial H&H and transfuse if necessary Silver nitrate recommended by surgeon Dr. Hampton Abbot who was consulted by ED IV hydration Surgery consulted to follow   Thrombocytopenia ([place]) Chronic Platelets 130,000, up from 94,000 the day prior Continue to monitor  Metabolic acidosis Expecting improvement with IV hydration  Uncontrolled type 2 diabetes mellitus with hyperglycemia, with long-term current use of insulin (HCC) Blood sugar 290 Continue basal insulin Sliding scale insulin coverage   Essential hypertension Will hold off on home antihypertensives of propranolol and lisinopril given SBP in the 50s on arrival of EMS  Stage 3a chronic kidney disease ([place]) Renal function at baseline  DVT prophylaxis: SCD  Consults: Surgery, Dr. Hampton Abbot  Advance Care Planning:   Code Status: Prior   Family Communication: none  Disposition Plan: Back to previous home environment  Severity of Illness: The appropriate patient status for this patient is OBSERVATION. Observation status is judged to be reasonable and necessary in order to provide the required intensity of service to ensure the patient's safety. The patient's presenting symptoms, physical exam findings, and initial radiographic and laboratory data in the context of their medical condition is felt to place them at decreased risk for further clinical deterioration. Furthermore, it is anticipated that the patient will be medically stable for discharge from the hospital within 2 midnights of admission.   Author: Athena Masse, MD 12/18/2022 11:38 PM  For on call review  [email or web address].

## 2022-12-18 NOTE — Assessment & Plan Note (Signed)
Renal function at baseline 

## 2022-12-18 NOTE — Assessment & Plan Note (Signed)
Will hold off on home antihypertensives of propranolol and lisinopril given SBP in the 50s on arrival of EMS

## 2022-12-18 NOTE — Assessment & Plan Note (Addendum)
Recurrent bleeding from around ileostomy stoma History of colectomy/ileostomy 2016 secondary to ulcerative colitis History of bleeding from stomal site 3 years prior requiring stitches and again on 3/18 improving with compressive dressing, now recurring and symptomatic for lightheadedness Hemoglobin 8.3, down from 10.6 at [facility] on 3/18 Serial H&H and transfuse if necessary Silver nitrate recommended by surgeon Dr. Hampton Abbot who was consulted by ED IV hydration Surgery consulted to follow

## 2022-12-19 ENCOUNTER — Encounter: Payer: Self-pay | Admitting: Internal Medicine

## 2022-12-19 ENCOUNTER — Other Ambulatory Visit: Payer: Self-pay

## 2022-12-19 DIAGNOSIS — I129 Hypertensive chronic kidney disease with stage 1 through stage 4 chronic kidney disease, or unspecified chronic kidney disease: Secondary | ICD-10-CM | POA: Diagnosis present

## 2022-12-19 DIAGNOSIS — R161 Splenomegaly, not elsewhere classified: Secondary | ICD-10-CM | POA: Diagnosis present

## 2022-12-19 DIAGNOSIS — I10 Essential (primary) hypertension: Secondary | ICD-10-CM | POA: Diagnosis not present

## 2022-12-19 DIAGNOSIS — D693 Immune thrombocytopenic purpura: Secondary | ICD-10-CM | POA: Diagnosis not present

## 2022-12-19 DIAGNOSIS — I959 Hypotension, unspecified: Secondary | ICD-10-CM | POA: Diagnosis present

## 2022-12-19 DIAGNOSIS — D62 Acute posthemorrhagic anemia: Secondary | ICD-10-CM | POA: Diagnosis present

## 2022-12-19 DIAGNOSIS — Z91048 Other nonmedicinal substance allergy status: Secondary | ICD-10-CM | POA: Diagnosis not present

## 2022-12-19 DIAGNOSIS — I130 Hypertensive heart and chronic kidney disease with heart failure and stage 1 through stage 4 chronic kidney disease, or unspecified chronic kidney disease: Secondary | ICD-10-CM | POA: Diagnosis not present

## 2022-12-19 DIAGNOSIS — K9411 Enterostomy hemorrhage: Secondary | ICD-10-CM | POA: Diagnosis present

## 2022-12-19 DIAGNOSIS — I509 Heart failure, unspecified: Secondary | ICD-10-CM | POA: Diagnosis not present

## 2022-12-19 DIAGNOSIS — Z87891 Personal history of nicotine dependence: Secondary | ICD-10-CM | POA: Diagnosis not present

## 2022-12-19 DIAGNOSIS — E872 Acidosis, unspecified: Secondary | ICD-10-CM

## 2022-12-19 DIAGNOSIS — K9401 Colostomy hemorrhage: Secondary | ICD-10-CM | POA: Diagnosis present

## 2022-12-19 DIAGNOSIS — K5190 Ulcerative colitis, unspecified, without complications: Secondary | ICD-10-CM | POA: Diagnosis present

## 2022-12-19 DIAGNOSIS — D6959 Other secondary thrombocytopenia: Secondary | ICD-10-CM | POA: Diagnosis present

## 2022-12-19 DIAGNOSIS — Z79899 Other long term (current) drug therapy: Secondary | ICD-10-CM | POA: Diagnosis not present

## 2022-12-19 DIAGNOSIS — Z885 Allergy status to narcotic agent status: Secondary | ICD-10-CM | POA: Diagnosis not present

## 2022-12-19 DIAGNOSIS — I8510 Secondary esophageal varices without bleeding: Secondary | ICD-10-CM | POA: Diagnosis present

## 2022-12-19 DIAGNOSIS — D500 Iron deficiency anemia secondary to blood loss (chronic): Secondary | ICD-10-CM | POA: Diagnosis present

## 2022-12-19 DIAGNOSIS — K7030 Alcoholic cirrhosis of liver without ascites: Secondary | ICD-10-CM | POA: Diagnosis present

## 2022-12-19 DIAGNOSIS — K3189 Other diseases of stomach and duodenum: Secondary | ICD-10-CM | POA: Diagnosis present

## 2022-12-19 DIAGNOSIS — K766 Portal hypertension: Secondary | ICD-10-CM | POA: Diagnosis present

## 2022-12-19 DIAGNOSIS — N1831 Chronic kidney disease, stage 3a: Secondary | ICD-10-CM | POA: Diagnosis present

## 2022-12-19 DIAGNOSIS — Z91041 Radiographic dye allergy status: Secondary | ICD-10-CM | POA: Diagnosis not present

## 2022-12-19 DIAGNOSIS — Z888 Allergy status to other drugs, medicaments and biological substances status: Secondary | ICD-10-CM | POA: Diagnosis not present

## 2022-12-19 DIAGNOSIS — Z794 Long term (current) use of insulin: Secondary | ICD-10-CM | POA: Diagnosis not present

## 2022-12-19 DIAGNOSIS — D696 Thrombocytopenia, unspecified: Secondary | ICD-10-CM | POA: Diagnosis not present

## 2022-12-19 DIAGNOSIS — K219 Gastro-esophageal reflux disease without esophagitis: Secondary | ICD-10-CM | POA: Diagnosis present

## 2022-12-19 DIAGNOSIS — E1165 Type 2 diabetes mellitus with hyperglycemia: Secondary | ICD-10-CM | POA: Diagnosis present

## 2022-12-19 DIAGNOSIS — E1122 Type 2 diabetes mellitus with diabetic chronic kidney disease: Secondary | ICD-10-CM | POA: Diagnosis present

## 2022-12-19 DIAGNOSIS — Z9049 Acquired absence of other specified parts of digestive tract: Secondary | ICD-10-CM | POA: Diagnosis not present

## 2022-12-19 DIAGNOSIS — Z932 Ileostomy status: Secondary | ICD-10-CM | POA: Diagnosis not present

## 2022-12-19 LAB — BASIC METABOLIC PANEL
Anion gap: 3 — ABNORMAL LOW (ref 5–15)
BUN: 31 mg/dL — ABNORMAL HIGH (ref 8–23)
CO2: 15 mmol/L — ABNORMAL LOW (ref 22–32)
Calcium: 8.2 mg/dL — ABNORMAL LOW (ref 8.9–10.3)
Chloride: 122 mmol/L — ABNORMAL HIGH (ref 98–111)
Creatinine, Ser: 1.49 mg/dL — ABNORMAL HIGH (ref 0.61–1.24)
GFR, Estimated: 50 mL/min — ABNORMAL LOW (ref >60)
Glucose, Bld: 137 mg/dL — ABNORMAL HIGH (ref 70–99)
Potassium: 4.4 mmol/L (ref 3.5–5.1)
Sodium: 140 mmol/L (ref 135–145)

## 2022-12-19 LAB — CBG MONITORING, ED
Glucose-Capillary: 195 mg/dL — ABNORMAL HIGH (ref 70–99)
Glucose-Capillary: 132 mg/dL — ABNORMAL HIGH (ref 70–99)
Glucose-Capillary: 210 mg/dL — ABNORMAL HIGH (ref 70–99)
Glucose-Capillary: 160 mg/dL — ABNORMAL HIGH (ref 70–99)

## 2022-12-19 LAB — GLUCOSE, CAPILLARY: Glucose-Capillary: 230 mg/dL — ABNORMAL HIGH (ref 70–99)

## 2022-12-19 LAB — PREPARE RBC (CROSSMATCH)

## 2022-12-19 LAB — HEMOGLOBIN
Hemoglobin: 6.9 g/dL — ABNORMAL LOW (ref 13.0–17.0)
Hemoglobin: 7.5 g/dL — ABNORMAL LOW (ref 13.0–17.0)

## 2022-12-19 LAB — ABO/RH: ABO/RH(D): O POS

## 2022-12-19 MED ORDER — SODIUM BICARBONATE 650 MG PO TABS
650.0000 mg | ORAL_TABLET | Freq: Two times a day (BID) | ORAL | Status: DC
  Administered 2022-12-19 – 2022-12-22 (×6): 650 mg via ORAL
  Filled 2022-12-19 (×6): qty 1

## 2022-12-19 MED ORDER — SODIUM CHLORIDE 0.9 % IV SOLN
50.0000 ug/h | INTRAVENOUS | Status: DC
  Filled 2022-12-19 (×2): qty 1

## 2022-12-19 MED ORDER — SODIUM CHLORIDE 0.9 % IV SOLN: INTRAVENOUS | Status: DC

## 2022-12-19 MED ORDER — OCTREOTIDE LOAD VIA INFUSION
50.0000 ug | Freq: Once | INTRAVENOUS | Status: DC
  Filled 2022-12-19: qty 25

## 2022-12-19 MED ORDER — SODIUM CHLORIDE 0.9 % IV BOLUS: 1000.0000 mL | Freq: Once | INTRAVENOUS | Status: DC

## 2022-12-19 MED ORDER — SODIUM CHLORIDE 0.9% IV SOLUTION
Freq: Once | INTRAVENOUS | Status: AC
  Filled 2022-12-19: qty 250

## 2022-12-19 NOTE — Progress Notes (Signed)
Inpatient Diabetes Program Recommendations  AACE/ADA: New Consensus Statement on Inpatient Glycemic Control (2015)  Target Ranges:  Prepandial:   less than 140 mg/dL      Peak postprandial:   less than 180 mg/dL (1-2 hours)      Critically ill patients:  140 - 180 mg/dL   Lab Results  Component Value Date   GLUCAP 210 (H) 12/19/2022   HGBA1C 8.0 (H) 10/25/2022    Review of Glycemic Control  Latest Reference Range & Units 12/19/22 01:50 12/19/22 07:42 12/19/22 12:23  Glucose-Capillary 70 - 99 mg/dL 195 (H) 132 (H) 210 (H)   Diabetes history: DM 2 Outpatient Diabetes medications:  Novolog 15 units tid with meals Basaglar 50 units in the AM and Basaglar 40 units in the evening Current orders for Inpatient glycemic control:  Novolog 0-15 units tid with meals and HS  Inpatient Diabetes Program Recommendations:    Note patient takes basal/bolus insulin at home.  Consider adding Semglee 25 units daily (approx. 0.3 units/kg) and Novolog 4 units tid with meals while in the hospital.    Thanks,  Adah Perl, RN, BC-ADM Inpatient Diabetes Coordinator Pager [phone]  (8a-5p)

## 2022-12-19 NOTE — Consult Note (Signed)
Date of Consultation:  12/19/2022  Requesting Physician:  Arta Silence, MD  Reason for Consultation:  Bleeding from ileostomy  History of Present Illness: Alexander Cunningham is a 72 y.o. male with a history of total abdominal colectomy with end ileostomy for ulcerative colitis in [place] several years ago.  He presented to the hospital last night due to bleeding from his ostomy.  He reports that he worked on the yard yesterday and then took a hot shower and sat down to watch TV with his wife.  When he got up, he noted his ostomy bag was full and had blood in it.  He reports this has happened in the past before.  He had one more recent episode where he was able to apply manual pressure at the stoma and was able to stop it, and before that, about 3 years ago, in [place], he had very significant bleeding that required stitches to control and units of blood transfusion.  The patient denies any pain, denies any nausea or vomiting, or otherwise any blood in the stool itself.  He feels the bleeding comes from the mucosa itself.  He has been seen by GI in the past and has had EGD with Dr. Virgina Jock, as well as abdominal imaging which shows that he has cirrhosis and portal hypertension with esophageal varices and splenomegaly.  He was told that in [place] for the bigger bleeding episode, there was discussion about a procedure to bypass things.  It is unclear what he means, but could be a TIPS procedure that was discussed, but eventually was not done.  He has been on propanolol.  In the ED, his initial workup showed a Hgb of 8.3, down from 9.9 on 10/28/22.  He had a CT scan of abdomen/pelvis which again shows cirrhosis but otherwise no abdominal free fluid or other source for his anemia.  This morning, his Hgb dropped to 6.9 and received a unit pRBC, with improvement of his Hgb to 7.5.  Last night, after discussing with ED provider, silver nitrate was applied onto the bleeding area of the stoma and he has not had  any bleeding today.  Past Medical History: Past Medical History:  Diagnosis Date   CHF (congestive heart failure) ([place])    Cirrhosis ([place])    Diabetes mellitus without complication ([place])    Esophageal varices (HCC)    GERD (gastroesophageal reflux disease)    Portal hypertension ([place])    Ulcerative colitis ([place])      Past Surgical History: Past Surgical History:  Procedure Laterality Date   ESOPHAGOGASTRODUODENOSCOPY N/A 08/17/2021   Procedure: ESOPHAGOGASTRODUODENOSCOPY (EGD);  Surgeon: Annamaria Helling, DO;  Location: [facility] ENDOSCOPY;  Service: Gastroenterology;  Laterality: N/A;  IDDM   ESOPHAGOGASTRODUODENOSCOPY N/A 08/30/2022   Procedure: ESOPHAGOGASTRODUODENOSCOPY (EGD);  Surgeon: Annamaria Helling, DO;  Location: [facility] ENDOSCOPY;  Service: Gastroenterology;  Laterality: N/A;   HERNIA REPAIR     TOTAL COLECTOMY     with end ileostomy    Home Medications: Prior to Admission medications   Medication Sig Start Date End Date Taking? Authorizing Provider  ferrous sulfate 325 (65 FE) MG tablet Take 325 mg by mouth every morning. 01/04/21  Yes [provider]  gabapentin (NEURONTIN) 300 MG capsule Take 300 mg by mouth 3 (three) times daily. 05/16/21  Yes [provider]  insulin aspart (NOVOLOG) 100 UNIT/ML FlexPen Inject 15 Units into the skin in the morning, at noon, and at bedtime.   Yes [provider]  Insulin Glargine (BASAGLAR  KWIKPEN) 100 UNIT/ML Inject 30 Units into the skin See admin instructions. Inject 50u under the skin every morning and inject 40u under the skin every night Patient taking differently: Inject 40-50 Units into the skin See admin instructions. Inject 50u under the skin every morning and inject 40u under the skin every night 10/28/22  Yes Sharen Hones, MD  magnesium oxide (MAG-OX) 400 MG tablet Take 1 tablet by mouth 2 (two) times daily. 05/03/21  Yes [provider]  omeprazole (PRILOSEC) 40 MG capsule Take 40 mg by  mouth daily. 05/26/21  Yes [provider]  propranolol (INDERAL) 10 MG tablet Take 10 mg by mouth 2 (two) times daily. 05/14/21  Yes [provider]  tamsulosin (FLOMAX) 0.4 MG CAPS capsule Take 1 capsule (0.4 mg total) by mouth daily. 11/12/22  Yes Billey Co, MD  lisinopril (ZESTRIL) 5 MG tablet Take 5 mg by mouth daily. Patient not taking: Reported on 12/19/2022 11/10/22   [provider]  sodium bicarbonate 650 MG tablet Take 650 mg by mouth 4 (four) times daily. Patient not taking: Reported on 12/19/2022    [provider]    Allergies: Allergies  Allergen Reactions   Iodinated Contrast Media Rash and Anaphylaxis    body feels on fire   Tricor [Fenofibrate] Rash   Other Rash    Pt states the bed sheets at the hospital gave him a bad purple rash on his back.   Oxycodone Hives    Social History:  reports that he has quit smoking. His smoking use included cigarettes. He has been exposed to tobacco smoke. He has never used smokeless tobacco. He reports current alcohol use. He reports that he does not use drugs.   Family History: No family history on file.  Review of Systems: Review of Systems  Constitutional:  Negative for chills and fever.  Respiratory:  Negative for shortness of breath.   Cardiovascular:  Negative for chest pain.  Gastrointestinal:  Negative for abdominal pain, nausea and vomiting.       Blood per stoma  Genitourinary:  Negative for dysuria.  Musculoskeletal:  Negative for myalgias.  Skin:  Negative for rash.    Physical Exam BP (!) 150/60   Pulse (!) 101   Temp 97.8 F (36.6 C) (Oral)   Resp (!) 25   Ht 5\' 10"  (1.778 m)   Wt 93.4 kg   SpO2 100%   BMI 29.56 kg/m  CONSTITUTIONAL: No acute distress HEENT:  Normocephalic, atraumatic, extraocular motion intact. NECK: Trachea is midline, and there is no jugular venous distension. RESPIRATORY:  Normal respiratory effort without pathologic use of accessory  muscles. CARDIOVASCULAR: Regular rhythm and rate. GI: The abdomen is soft, non-distended, non-tender.  Patient's end ileostomy bag contains brown/yellow liquid stool, no gross blood.  The mucosa has some evidence of varices, currently without any bleeding. MUSCULOSKELETAL:  Normal muscle strength and tone in all four extremities.  No peripheral edema or cyanosis. SKIN: Skin turgor is normal. There are no pathologic skin lesions.  NEUROLOGIC:  Motor and sensation is grossly normal.  Cranial nerves are grossly intact. PSYCH:  Alert and oriented to person, place and time. Affect is normal.  Laboratory Analysis: Results for orders placed or performed during the hospital encounter of 12/18/22 (from the past 24 hour(s))  Comprehensive metabolic panel     Status: Abnormal   Collection Time: 12/18/22 10:20 PM  Result Value Ref Range   Sodium 137 135 - 145 mmol/L   Potassium 5.0  3.5 - 5.1 mmol/L   Chloride 117 (H) 98 - 111 mmol/L   CO2 17 (L) 22 - 32 mmol/L   Glucose, Bld 290 (H) 70 - 99 mg/dL   BUN 28 (H) 8 - 23 mg/dL   Creatinine, Ser 1.61 (H) 0.61 - 1.24 mg/dL   Calcium 8.2 (L) 8.9 - 10.3 mg/dL   Total Protein 5.0 (L) 6.5 - 8.1 g/dL   Albumin 2.6 (L) 3.5 - 5.0 g/dL   AST 29 15 - 41 U/L   ALT 18 0 - 44 U/L   Alkaline Phosphatase 80 38 - 126 U/L   Total Bilirubin 0.6 0.3 - 1.2 mg/dL   GFR, Estimated 45 (L) >60 mL/min   Anion gap 3 (L) 5 - 15  CBC WITH DIFFERENTIAL     Status: Abnormal   Collection Time: 12/18/22 10:20 PM  Result Value Ref Range   WBC 6.8 4.0 - 10.5 K/uL   RBC 2.62 (L) 4.22 - 5.81 MIL/uL   Hemoglobin 8.3 (L) 13.0 - 17.0 g/dL   HCT 25.5 (L) 39.0 - 52.0 %   MCV 97.3 80.0 - 100.0 fL   MCH 31.7 26.0 - 34.0 pg   MCHC 32.5 30.0 - 36.0 g/dL   RDW 13.5 11.5 - 15.5 %   Platelets 130 (L) 150 - 400 K/uL   nRBC 0.0 0.0 - 0.2 %   Neutrophils Relative % 51 %   Neutro Abs 3.4 1.7 - 7.7 K/uL   Lymphocytes Relative 37 %   Lymphs Abs 2.5 0.7 - 4.0 K/uL   Monocytes Relative 6 %    Monocytes Absolute 0.4 0.1 - 1.0 K/uL   Eosinophils Relative 5 %   Eosinophils Absolute 0.3 0.0 - 0.5 K/uL   Basophils Relative 1 %   Basophils Absolute 0.1 0.0 - 0.1 K/uL   Immature Granulocytes 0 %   Abs Immature Granulocytes 0.02 0.00 - 0.07 K/uL  Protime-INR     Status: Abnormal   Collection Time: 12/18/22 10:20 PM  Result Value Ref Range   Prothrombin Time 18.6 (H) 11.4 - 15.2 seconds   INR 1.6 (H) 0.8 - 1.2  Lactic acid, plasma     Status: None   Collection Time: 12/18/22 10:20 PM  Result Value Ref Range   Lactic Acid, Venous 1.5 0.5 - 1.9 mmol/L  Type and screen Ordered by PROVIDER DEFAULT     Status: None (Preliminary result)   Collection Time: 12/18/22 11:00 PM  Result Value Ref Range   ABO/RH(D) O POS    Antibody Screen NEG    Sample Expiration 12/21/2022,2359    Unit Number [id number]    Blood Component Type RED CELLS,LR    Unit division 00    Status of Unit ISSUED    Transfusion Status OK TO TRANSFUSE    Crossmatch Result      Compatible Performed at [facility], [address] [place], [place] [address]   CBG monitoring, ED     Status: Abnormal   Collection Time: 12/19/22  1:50 AM  Result Value Ref Range   Glucose-Capillary 195 (H) 70 - 99 mg/dL  Hemoglobin     Status: Abnormal   Collection Time: 12/19/22  5:55 AM  Result Value Ref Range   Hemoglobin 6.9 (L) 13.0 - 17.0 g/dL  Basic metabolic panel     Status: Abnormal   Collection Time: 12/19/22  5:55 AM  Result Value Ref Range   Sodium 140 135 - 145 mmol/L   Potassium 4.4  3.5 - 5.1 mmol/L   Chloride 122 (H) 98 - 111 mmol/L   CO2 15 (L) 22 - 32 mmol/L   Glucose, Bld 137 (H) 70 - 99 mg/dL   BUN 31 (H) 8 - 23 mg/dL   Creatinine, Ser 1.49 (H) 0.61 - 1.24 mg/dL   Calcium 8.2 (L) 8.9 - 10.3 mg/dL   GFR, Estimated 50 (L) >60 mL/min   Anion gap 3 (L) 5 - 15  ABO/Rh     Status: None   Collection Time: 12/19/22  5:55 AM  Result Value Ref Range   ABO/RH(D)      O POS Performed at [facility], [address] [place], [place] [address]   CBG monitoring, ED     Status: Abnormal   Collection Time: 12/19/22  7:42 AM  Result Value Ref Range   Glucose-Capillary 132 (H) 70 - 99 mg/dL  Prepare RBC (crossmatch)     Status: None   Collection Time: 12/19/22  7:59 AM  Result Value Ref Range   Order Confirmation      ORDER PROCESSED BY BLOOD BANK Performed at [facility], [address] [place], [place] [address]   CBG monitoring, ED     Status: Abnormal   Collection Time: 12/19/22 12:23 PM  Result Value Ref Range   Glucose-Capillary 210 (H) 70 - 99 mg/dL  Hemoglobin     Status: Abnormal   Collection Time: 12/19/22  3:30 PM  Result Value Ref Range   Hemoglobin 7.5 (L) 13.0 - 17.0 g/dL    Imaging: CT ABDOMEN PELVIS WO CONTRAST  Result Date: 12/18/2022 CLINICAL DATA:  Abdominal pain.  Lower GI bleed. EXAM: CT ABDOMEN AND PELVIS WITHOUT CONTRAST TECHNIQUE: Multidetector CT imaging of the abdomen and pelvis was performed following the standard protocol without IV contrast. RADIATION DOSE REDUCTION: This exam was performed according to the departmental dose-optimization program which includes automated exposure control, adjustment of the mA and/or kV according to patient size and/or use of iterative reconstruction technique. COMPARISON:  CT abdomen pelvis 03/05/2022. FINDINGS: Evaluation of this exam is limited in the absence of intravenous contrast. Lower chest: Diffuse chronic interstitial coarsening of the lung bases. The visualized lung bases are otherwise clear. No intra-abdominal free air or free fluid. Hepatobiliary: There is irregular appearance of the liver contour in keeping with cirrhosis. The gallbladder is not visualized, likely surgically absent. Pancreas: Unremarkable. No pancreatic ductal dilatation or surrounding inflammatory changes. Spleen: Normal in size without focal abnormality. Adrenals/Urinary Tract: The adrenal glands are unremarkable. There is  no hydronephrosis or nephrolithiasis on either side. The visualized ureters appear unremarkable. The urinary bladder is collapsed. Stomach/Bowel: Postsurgical changes of total colectomy with a right lower quadrant ileostomy. There is no bowel obstruction or active inflammation. Vascular/Lymphatic: Moderate aortoiliac atherosclerotic disease. The IVC is unremarkable. No portal venous gas. There is no adenopathy. Reproductive: The prostate gland is enlarged measuring 5.3 cm in transverse axial diameter. The seminal vesicles are symmetric. Other: None Musculoskeletal: Degenerative changes of the spine. No acute osseous pathology. IMPRESSION: 1. No acute intra-abdominal or pelvic pathology. 2. Cirrhosis. 3. Postsurgical changes of total colectomy with a right lower quadrant ileostomy. No bowel obstruction. 4.  Aortic Atherosclerosis (ICD10-I70.0). Electronically Signed   By: Anner Crete M.D.   On: 12/18/2022 22:49    Assessment and Plan: This is a 72 y.o. male with bleeding mucosa from end ileostomy.  --Discussed with the patient that the bleeding is very likely related to his cirrhosis and portal  hypertension, creating varices within the intestine.  These then are at high risk of bleeding, particularly at this ileostomy where the mucosa is exposed and can be irritated with manipulation.  Currently he is not bleeding and no acute surgical intervention is needed.  However, discussed with him that controlling or improving his portal hypertension would be beneficial.  As a last case scenario, would need ostomy reversal, but without mitigating the risk with his portal hypertension, this would be bound to happen again.  Could also do suture ligation as has been done before vs embolization or TIPS procedure as recommended by Dr. Marius Ditch. --GI has also evaluated the patient today and will do EGD and ileoscopy tomorrow to evaluate for any other source of his acute blood loss anemia.  Otherwise, defer further management  of his portal hypertension to the GI team.   --We'll be available if any acute bleeding happens again from his stoma mucosa.  Silver nitrate seemed to work well last time.  I spent 45 minutes dedicated to the care of this patient on the date of this encounter to include pre-visit review of records, face-to-face time with the patient discussing diagnosis and management, and any post-visit coordination of care.   Melvyn Neth, MD [facility] Pg:  [phone]

## 2022-12-19 NOTE — Progress Notes (Signed)
PROGRESS NOTE    Alexander Cunningham  [id number] DOB: 12/15/1950 DOA: 12/18/2022 PCP: Juluis Pitch, MD  Assessment & Plan:   Principal Problem:   ABLA (acute blood loss anemia) Active Problems:   Bleeding from ileostomy stoma (HCC)   Thrombocytopenia ([place])   Uncontrolled type 2 diabetes mellitus with hyperglycemia, with long-term current use of insulin (HCC)   Metabolic acidosis   Stage 3a chronic kidney disease ([place])   Essential hypertension  Assessment and Plan: Acute blood loss anemia: etiology unclear, possibly recurrent bleeding from ileostomy stoma vs esophageal varices as pt hx of cirrhosis. Hx of colectomy/ileostomy 2016 secondary to hx of ulcerative colitis. Hx of bleeding from stomal site 3 years prior requiring sutures & again on 3/18 improving w/ compressive dressing. H&H is trending down.  Will transfuse 1 unit of pRBCs for Hb < 7.0. Repeat H&H ordered. CT abd/pelvis shows no acute intra abd/pelvic abnormality but cirrhosis. Gen surg & GI consulted.   Chronic thrombocytopenia: likely secondary to cirrhosis. Will continue to monitor   Metabolic acidosis: continue on IVFs. Will start po NaHCO3  Alcoholic cirrhosis: will continue to hold home dose of propranolol secondary to low normal BP.   DM2: poorly controlled, HbA1c 8.0 in 10/2022. Continue on SSI w/ accuchecks  HTN: holding all home anti-HTN as BP is low normal. Continue to monitor closely  CKDIIIa: Cr is at baseline. Will continue to monitor      DVT prophylaxis: SCDs Code Status: full  Family Communication:  Disposition Plan: depends on PT/OT recs (not consulted yet secondary to acute blood loss anemia & needing pRBC transfusion today)   Level of care: Progressive  Status is: Inpatient Remains inpatient appropriate because: severity of illness      Consultants:  Gen surg GI   Procedures:  Antimicrobials:   Subjective: Pt c/o fatigue   Objective: Vitals:   12/19/22 0619 12/19/22  0744 12/19/22 0800 12/19/22 0811  BP:  (!) 102/49  (!) 91/53  Pulse:  69  72  Resp:  19  17  Temp: 97.8 F (36.6 C) 98 F (36.7 C)    TempSrc: Oral Oral    SpO2:  99% 100% 99%  Weight:      Height:        Intake/Output Summary (Last 24 hours) at 12/19/2022 0908 Last data filed at 12/19/2022 0044 Gross per 24 hour  Intake 1000 ml  Output --  Net 1000 ml   Filed Weights   12/18/22 2206  Weight: 93.4 kg    Examination:  General exam: Appears calm and comfortable  Respiratory system: Clear to auscultation. Respiratory effort normal. Cardiovascular system: S1 & S2+. No  rubs, gallops or clicks.  Gastrointestinal system: Abdomen is nondistended, soft and nontender. Normal bowel sounds heard. Central nervous system: Alert and awake. Moves all extremities  Psychiatry: Judgement and insight appears at baseline. Mood & affect appropriate.     Data Reviewed: I have personally reviewed following labs and imaging studies  CBC: Recent Labs  Lab 12/18/22 2220 12/19/22 0555  WBC 6.8  --   NEUTROABS 3.4  --   HGB 8.3* 6.9*  HCT 25.5*  --   MCV 97.3  --   PLT 130*  --    Basic Metabolic Panel: Recent Labs  Lab 12/18/22 2220 12/19/22 0555  NA 137 140  K 5.0 4.4  CL 117* 122*  CO2 17* 15*  GLUCOSE 290* 137*  BUN 28* 31*  CREATININE 1.61* 1.49*  CALCIUM 8.2* 8.2*  GFR: Estimated Creatinine Clearance: 51.5 mL/min (A) (by C-G formula based on SCr of 1.49 mg/dL (H)). Liver Function Tests: Recent Labs  Lab 12/18/22 2220  AST 29  ALT 18  ALKPHOS 80  BILITOT 0.6  PROT 5.0*  ALBUMIN 2.6*   No results for input(s): "LIPASE", "AMYLASE" in the last 168 hours. No results for input(s): "AMMONIA" in the last 168 hours. Coagulation Profile: Recent Labs  Lab 12/18/22 2220  INR 1.6*   Cardiac Enzymes: No results for input(s): "CKTOTAL", "CKMB", "CKMBINDEX", "TROPONINI" in the last 168 hours. BNP (last 3 results) No results for input(s): "PROBNP" in the last 8760  hours. HbA1C: No results for input(s): "HGBA1C" in the last 72 hours. CBG: Recent Labs  Lab 12/19/22 0150 12/19/22 0742  GLUCAP 195* 132*   Lipid Profile: No results for input(s): "CHOL", "HDL", "LDLCALC", "TRIG", "CHOLHDL", "LDLDIRECT" in the last 72 hours. Thyroid Function Tests: No results for input(s): "TSH", "T4TOTAL", "FREET4", "T3FREE", "THYROIDAB" in the last 72 hours. Anemia Panel: No results for input(s): "VITAMINB12", "FOLATE", "FERRITIN", "TIBC", "IRON", "RETICCTPCT" in the last 72 hours. Sepsis Labs: Recent Labs  Lab 12/18/22 2220  LATICACIDVEN 1.5    No results found for this or any previous visit (from the past 240 hour(s)).       Radiology Studies: CT ABDOMEN PELVIS WO CONTRAST  Result Date: 12/18/2022 CLINICAL DATA:  Abdominal pain.  Lower GI bleed. EXAM: CT ABDOMEN AND PELVIS WITHOUT CONTRAST TECHNIQUE: Multidetector CT imaging of the abdomen and pelvis was performed following the standard protocol without IV contrast. RADIATION DOSE REDUCTION: This exam was performed according to the departmental dose-optimization program which includes automated exposure control, adjustment of the mA and/or kV according to patient size and/or use of iterative reconstruction technique. COMPARISON:  CT abdomen pelvis 03/05/2022. FINDINGS: Evaluation of this exam is limited in the absence of intravenous contrast. Lower chest: Diffuse chronic interstitial coarsening of the lung bases. The visualized lung bases are otherwise clear. No intra-abdominal free air or free fluid. Hepatobiliary: There is irregular appearance of the liver contour in keeping with cirrhosis. The gallbladder is not visualized, likely surgically absent. Pancreas: Unremarkable. No pancreatic ductal dilatation or surrounding inflammatory changes. Spleen: Normal in size without focal abnormality. Adrenals/Urinary Tract: The adrenal glands are unremarkable. There is no hydronephrosis or nephrolithiasis on either side.  The visualized ureters appear unremarkable. The urinary bladder is collapsed. Stomach/Bowel: Postsurgical changes of total colectomy with a right lower quadrant ileostomy. There is no bowel obstruction or active inflammation. Vascular/Lymphatic: Moderate aortoiliac atherosclerotic disease. The IVC is unremarkable. No portal venous gas. There is no adenopathy. Reproductive: The prostate gland is enlarged measuring 5.3 cm in transverse axial diameter. The seminal vesicles are symmetric. Other: None Musculoskeletal: Degenerative changes of the spine. No acute osseous pathology. IMPRESSION: 1. No acute intra-abdominal or pelvic pathology. 2. Cirrhosis. 3. Postsurgical changes of total colectomy with a right lower quadrant ileostomy. No bowel obstruction. 4.  Aortic Atherosclerosis (ICD10-I70.0). Electronically Signed   By: Anner Crete M.D.   On: 12/18/2022 22:49        Scheduled Meds:  insulin aspart  0-15 Units Subcutaneous TID WC   insulin aspart  0-5 Units Subcutaneous QHS   pantoprazole  40 mg Oral Daily   tamsulosin  0.4 mg Oral Daily   Continuous Infusions:   LOS: 0 days    Time spent: 35  mins    Wyvonnia Dusky, MD [facility] Pager [phone]  If 7PM-7AM, please contact night-coverage [email or web address] 12/19/2022, 9:08  AM

## 2022-12-19 NOTE — ED Provider Notes (Signed)
[facility] Provider Note    Event Date/Time   First MD Initiated Contact with Patient 12/18/22 2150     (approximate)   History   GI Problem   HPI  Alexander Cunningham is a 72 y.o. male with a history of chronic kidney disease, diabetes, hypertension, and ulcerative colitis status post colostomy several years ago who presents with bleeding from the colostomy site which he states began acutely tonight.  He denies any nausea or vomiting.  He does feel lightheaded and weak.  EMS states that on their arrival the blood pressure was in the [id number] systolic although it has currently improved.  The patient denies any other abnormal bleeding or bruising.  He states he has had 1 prior episode of bleeding like this about 3 to 4 years ago that resolved.  I reviewed the past medical records.  The was most recent admitted here in January for AKI, metabolic acidosis, and urinary retention.  However, he did present to the [facility] ED yesterday with similar bleeding from the colostomy site after taking a hot shower.  The bleeding had stopped by the time he arrived in the ED.  His hemoglobin was 10.6 at that time.  The ostomy bag was replaced.   Physical Exam   Triage Vital Signs: ED Triage Vitals  Enc Vitals Group     BP 12/18/22 2204 (!) 128/59     Pulse Rate 12/18/22 2204 66     Resp 12/18/22 2204 18     Temp 12/18/22 2204 (!) 97.5 F (36.4 C)     Temp Source 12/18/22 2204 Oral     SpO2 12/18/22 2204 100 %     Weight 12/18/22 2206 206 lb (93.4 kg)     Height 12/18/22 2206 5\' 10"  (1.778 m)     Head Circumference --      Peak Flow --      Pain Score 12/18/22 2205 0     Pain Loc --      Pain Edu? --      Excl. in [place]? --     Most recent vital signs: Vitals:   12/18/22 2204  BP: (!) 128/59  Pulse: 66  Resp: 18  Temp: (!) 97.5 F (36.4 C)  SpO2: 100%     General: Awake, no distress.  CV:  Good peripheral perfusion.  Resp:  Normal effort.  Abd:  No distention.  Soft  with mild diffuse tenderness.  Right lower quadrant ostomy site with blood oozing circumferentially from the ostomy wound inside the intact skin.  No active bleeding from the lumen. Other:  Slightly pale appearing.  No jaundice or scleral icterus.  Moist mucous membranes.   ED Results / Procedures / Treatments   Labs (all labs ordered are listed, but only abnormal results are displayed) Labs Reviewed  COMPREHENSIVE METABOLIC PANEL - Abnormal; Notable for the following components:      Result Value   Chloride 117 (*)    CO2 17 (*)    Glucose, Bld 290 (*)    BUN 28 (*)    Creatinine, Ser 1.61 (*)    Calcium 8.2 (*)    Total Protein 5.0 (*)    Albumin 2.6 (*)    GFR, Estimated 45 (*)    Anion gap 3 (*)    All other components within normal limits  CBC WITH DIFFERENTIAL/PLATELET - Abnormal; Notable for the following components:   RBC 2.62 (*)    Hemoglobin 8.3 (*)  HCT 25.5 (*)    Platelets 130 (*)    All other components within normal limits  PROTIME-INR - Abnormal; Notable for the following components:   Prothrombin Time 18.6 (*)    INR 1.6 (*)    All other components within normal limits  LACTIC ACID, PLASMA  HEMOGLOBIN  HEMOGLOBIN  HEMOGLOBIN  BASIC METABOLIC PANEL  TYPE AND SCREEN  TYPE AND SCREEN     EKG  ED ECG REPORT I, Arta Silence, the attending physician, personally viewed and interpreted this ECG.  Date: 12/19/2022 EKG Time: 2200 Rate: 67 Rhythm: normal sinus rhythm QRS Axis: normal Intervals: normal ST/T Wave abnormalities: normal Narrative Interpretation: no evidence of acute ischemia    RADIOLOGY  CT abdomen/pelvis: I independently viewed and interpreted the images; there are no dilated bowel loops or any free air or free fluid.  Radiology report indicates no acute abnormality.  PROCEDURES:  Critical Care performed: Yes, see critical care procedure note(s)  .Critical Care  Performed by: Arta Silence, MD Authorized by:  Arta Silence, MD   Critical care provider statement:    Critical care time (minutes):  30   Critical care time was exclusive of:  Separately billable procedures and treating other patients   Critical care was necessary to treat or prevent imminent or life-threatening deterioration of the following conditions:  Circulatory failure and shock   Critical care was time spent personally by me on the following activities:  Development of treatment plan with patient or surrogate, discussions with consultants, evaluation of patient's response to treatment, examination of patient, ordering and review of laboratory studies, ordering and review of radiographic studies, ordering and performing treatments and interventions, pulse oximetry, re-evaluation of patient's condition, review of old charts and obtaining history from patient or surrogate   Care discussed with: admitting provider      MEDICATIONS ORDERED IN ED: Medications  tamsulosin (FLOMAX) capsule 0.4 mg (has no administration in time range)  pantoprazole (PROTONIX) EC tablet 40 mg (has no administration in time range)  acetaminophen (TYLENOL) tablet 650 mg (has no administration in time range)    Or  acetaminophen (TYLENOL) suppository 650 mg (has no administration in time range)  ondansetron (ZOFRAN) tablet 4 mg (has no administration in time range)    Or  ondansetron (ZOFRAN) injection 4 mg (has no administration in time range)  insulin aspart (novoLOG) injection 0-15 Units (has no administration in time range)  insulin aspart (novoLOG) injection 0-5 Units (has no administration in time range)  0.9 %  sodium chloride infusion (has no administration in time range)  sodium chloride 0.9 % bolus 1,000 mL (1,000 mLs Intravenous New Bag/Given 12/18/22 2234)  silver nitrate applicators applicator 1 Application (1 Application Topical Given 12/19/22 0019)     IMPRESSION / MDM / [place] / ED COURSE  I reviewed the triage vital  signs and the nursing notes.  72 year old male with PMH as noted above presents with acute bleeding from his colostomy although on exam it appears that the bleeding is more from the edges of the colostomy wound itself rather than the lumen of the GI tract.  He had a similar presentation to [facility] yesterday with no further bleeding and was discharged home.  He was hypotensive with EMS but is normotensive currently.  Differential diagnosis includes, but is not limited to, colostomy wound bleeding, trauma, lower GI bleed due to diverticulosis, colitis, or other acute etiology.  We will give fluids, obtain lab workup, CT abdomen/pelvis and reassess.  I will consult general surgery.  Patient's presentation is most consistent with acute presentation with potential threat to life or bodily function.  The patient is on the cardiac monitor to evaluate for evidence of arrhythmia and/or significant heart rate changes.  ----------------------------------------- 11:37 AM on 12/19/2022 -----------------------------------------  CT is negative for acute findings.  Lab workup is reassuring so far.  The patient's hemoglobin is 8.3 which is down slightly from his visit to [facility] yesterday.  Creatinine is consistent with baseline.  Lactate is normal.  I consulted and discussed the case with Dr. Hampton Abbot from general surgery who recommended applying silver nitrate to the edges of the colostomy.  The patient will need admission for serial hemoglobins and further workup and monitoring.  I consulted Dr. Damita Dunnings from the hospitalist service; based our discussion she agreed to admit the patient.   FINAL CLINICAL IMPRESSION(S) / ED DIAGNOSES   Final diagnoses:  Bleeding from colostomy [facility])     Rx / DC Orders   ED Discharge Orders     None        Note:  This document was prepared using Dragon voice recognition software and may include unintentional dictation errors.    Arta Silence, MD 12/19/22  [phone]

## 2022-12-19 NOTE — Consult Note (Signed)
[name], MD [address]  [place]  [place], [place] [address]  Main: [phone]  Fax: [phone] Pager: [phone]   Consultation  Referring Provider:     No ref. provider found Primary Care Physician:  Juluis Pitch, MD Primary Gastroenterologist: Dr. Mordecai Rasmussen      Reason for Consultation: Bleeding from the ostomy site Date of Admission:  12/18/2022 Date of Consultation:  12/19/2022         HPI:   Alexander Cunningham is a 72 y.o. male with history of type 2 diabetes, hypertension, ulcerative colitis s/p colectomy and end ileostomy in right lower quadrant since 2016, compensated cirrhosis of liver with splenomegaly, portal hypertension with esophageal varices presented to ER yesterday secondary to bleeding around the ostomy site associated with symptomatic anemia.  He presented to [facility] ER on 3/18 after he noticed blood squirting around his ostomy site after taking shower.  This bleeding stopped with pressure and he was discharged.  His hemoglobin was 10.6.  Because the episode recurred today, continued to ooze from the stoma, he called EMS.  Patient reported having similar episode 4 years ago, had stitches for hemostasis.  Patient was mildly hypotensive when EMS arrived, received IV fluids.  He was found to have hemoglobin 8.3, which further dropped to 6.9, patient received 1 unit of PRBCs today, hemoglobin improved to 7.5.  He underwent CT abdomen and pelvis which was unremarkable. Dr. Hampton Abbot recommended to apply silver nitrate around the site. When I saw the patient today, his stomal output was semisolid brown stool.  There was no bleeding.  The stoma has a small nipple like lesion which is likely the source of bleeding.  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 STOMACH, RANDOM; COLD BIOPSY: - PORTAL HYPERTENSIVE GASTROPATHY, FOCAL IRON PILL GASTRITIS, AND CHANGES CONSISTENT WITH PROTON PUMP INHIBITOR EFFECT. - NEGATIVE FOR H. PYLORI, INTESTINAL METAPLASIA, DYSPLASIA, AND MALIGNANCY.  STOMACH POLYP X2, ANTRUM; COLD BIOPSY: - POLYPOID FRAGMENTS OF ANTRAL MUCOSA WITH REACTIVE FOVEOLAR HYPERPLASIA AND SUPERIMPOSED CHANGES SUGGESTIVE OF PROLAPSE. - NEGATIVE FOR H. PYLORI, INTESTINAL METAPLASIA, DYSPLASIA, AND MALIGNANCY.  C.  STOMACH, IRREGULAR Z-LINE; COLD BIOPSY: - SQUAMOCOLUMNAR JUNCTIONAL MUCOSA WITH CHANGES CONSISTENT WITH REFLUX. - NEGATIVE FOR INTESTINAL METAPLASIA, DYSPLASIA, AND MALIGNANCY.    Past Medical History:  Diagnosis Date   CHF (congestive heart failure) (HCC)    Cirrhosis ([place])    Diabetes mellitus without complication ([place])    Esophageal varices (HCC)    GERD (gastroesophageal reflux disease)    Portal hypertension ([place])    Ulcerative colitis ([place])     Past Surgical History:  Procedure Laterality Date   ESOPHAGOGASTRODUODENOSCOPY N/A 08/17/2021   Procedure: ESOPHAGOGASTRODUODENOSCOPY (EGD);  Surgeon: Annamaria Helling, DO;  Location: [facility] ENDOSCOPY;  Service: Gastroenterology;  Laterality: N/A;  IDDM   ESOPHAGOGASTRODUODENOSCOPY N/A 08/30/2022   Procedure: ESOPHAGOGASTRODUODENOSCOPY (EGD);  Surgeon: Annamaria Helling, DO;  Location: [facility] ENDOSCOPY;  Service: Gastroenterology;  Laterality: N/A;   HERNIA REPAIR     TOTAL COLECTOMY     with end ileostomy     Current Facility-Administered Medications:    0.9 %  sodium chloride infusion, , Intravenous, Continuous, Wyvonnia Dusky, MD, Last Rate: 75 mL/hr at 12/19/22 1433, Restarted at 12/19/22 1433   acetaminophen (TYLENOL) tablet 650 mg, 650 mg, Oral, Q6H PRN, 650 mg at 12/19/22 0834 **  OR** acetaminophen (TYLENOL) suppository 650 mg, 650 mg, Rectal, Q6H PRN, Athena Masse, MD   insulin aspart (novoLOG) injection 0-15 Units, 0-15  Units, Subcutaneous, TID WC, Athena Masse, MD, 3 Units at 12/19/22 1655   insulin aspart (novoLOG) injection 0-5 Units, 0-5 Units, Subcutaneous, QHS, Duncan, Hazel V, MD   ondansetron (ZOFRAN) tablet 4 mg, 4 mg, Oral, Q6H PRN **OR** ondansetron (ZOFRAN) injection 4 mg, 4 mg, Intravenous, Q6H PRN, Athena Masse, MD   pantoprazole (PROTONIX) EC tablet 40 mg, 40 mg, Oral, Daily, Judd Gaudier V, MD, 40 mg at 12/19/22 1011   sodium bicarbonate tablet 650 mg, 650 mg, Oral, BID, Wyvonnia Dusky, MD   tamsulosin [facility]) capsule 0.4 mg, 0.4 mg, Oral, Daily, Judd Gaudier V, MD, 0.4 mg at 12/19/22 1011   No family history on file.   Social History   Tobacco Use   Smoking status: Former    Types: Cigarettes    Passive exposure: Past   Smokeless tobacco: Never  Vaping Use   Vaping Use: Never used  Substance Use Topics   Alcohol use: Yes    Comment: 3 beers in 4 months   Drug use: Never    Allergies as of 12/18/2022 - Review Complete 12/18/2022  Allergen Reaction Noted   Contrast media [iodinated contrast media] Rash 06/08/2021   Other Rash 06/15/2021   Oxycodone Hives 03/06/2022   Tricor [fenofibrate] Rash 06/08/2021    Review of Systems:    All systems reviewed and negative except where noted in HPI.   Physical Exam:  Vital signs in last 24 hours: Temp:  [97.5 F (36.4 C)-98 F (36.7 C)] 97.8 F (36.6 C) (03/20 1430) Pulse Rate:  [62-101] 101 (03/20 1400) Resp:  [16-25] 25 (03/20 1400) BP: (91-150)/(37-89) 150/60 (03/20 1400) SpO2:  [99 %-100 %] 100 % (03/20 1400) Weight:  [93.4 kg] 93.4 kg (03/19 2206)   General:   Pleasant, cooperative in NAD Head:  Normocephalic and atraumatic. Eyes:   No icterus.   Conjunctiva pink. PERRLA. Ears:  Normal auditory acuity. Neck:  Supple; no masses or thyroidomegaly Lungs: Respirations even and unlabored. Lungs clear to auscultation bilaterally.   No wheezes, crackles, or rhonchi.  Heart:  Regular rate and rhythm;  Without  murmur, clicks, rubs or gallops Abdomen:  Soft, nondistended, nontender. Normal bowel sounds. No appreciable masses or hepatomegaly.  No rebound or guarding.  Rectal:  Not performed. Msk:  Symmetrical without gross deformities.  Strength generalized weakness Extremities:  Without edema, cyanosis or clubbing. Neurologic:  Alert and oriented x3;  grossly normal neurologically. Skin:  Intact without significant lesions or rashes. Cervical Nodes:  No significant cervical adenopathy. Psych:  Alert and cooperative. Normal affect.  LAB RESULTS:    Latest Ref Rng & Units 12/19/2022    3:30 PM 12/19/2022    5:55 AM 12/18/2022   10:20 PM  CBC  WBC 4.0 - 10.5 K/uL   6.8   Hemoglobin 13.0 - 17.0 g/dL 7.5  6.9  8.3   Hematocrit 39.0 - 52.0 %   25.5   Platelets 150 - 400 K/uL   130     BMET    Latest Ref Rng & Units 12/19/2022    5:55 AM 12/18/2022   10:20 PM 10/28/2022    4:30 AM  BMP  Glucose 70 - 99 mg/dL 137  290  186   BUN 8 - 23 mg/dL 31  28  94   Creatinine 0.61 - 1.24 mg/dL 1.49  1.61  1.94   Sodium 135 - 145 mmol/L 140  137  139   Potassium 3.5 - 5.1 mmol/L 4.4  5.0  3.7   Chloride 98 - 111 mmol/L 122  117  107   CO2 22 - 32 mmol/L 15  17  21    Calcium 8.9 - 10.3 mg/dL 8.2  8.2  8.4     LFT    Latest Ref Rng & Units 12/18/2022   10:20 PM 10/26/2022    6:27 AM 10/25/2022    6:55 PM  Hepatic Function  Total Protein 6.5 - 8.1 g/dL 5.0  6.6  8.8   Albumin 3.5 - 5.0 g/dL 2.6  3.1  4.2   AST 15 - 41 U/L 29  40  31   ALT 0 - 44 U/L 18  28  34   Alk Phosphatase 38 - 126 U/L 80  41  57   Total Bilirubin 0.3 - 1.2 mg/dL 0.6  0.6  1.2      STUDIES: CT ABDOMEN PELVIS WO CONTRAST  Result Date: 12/18/2022 CLINICAL DATA:  Abdominal pain.  Lower GI bleed. EXAM: CT ABDOMEN AND PELVIS WITHOUT CONTRAST TECHNIQUE: Multidetector CT imaging of the abdomen and pelvis was performed following the standard protocol without IV contrast. RADIATION DOSE REDUCTION: This exam was performed according to  the departmental dose-optimization program which includes automated exposure control, adjustment of the mA and/or kV according to patient size and/or use of iterative reconstruction technique. COMPARISON:  CT abdomen pelvis 03/05/2022. FINDINGS: Evaluation of this exam is limited in the absence of intravenous contrast. Lower chest: Diffuse chronic interstitial coarsening of the lung bases. The visualized lung bases are otherwise clear. No intra-abdominal free air or free fluid. Hepatobiliary: There is irregular appearance of the liver contour in keeping with cirrhosis. The gallbladder is not visualized, likely surgically absent. Pancreas: Unremarkable. No pancreatic ductal dilatation or surrounding inflammatory changes. Spleen: Normal in size without focal abnormality. Adrenals/Urinary Tract: The adrenal glands are unremarkable. There is no hydronephrosis or nephrolithiasis on either side. The visualized ureters appear unremarkable. The urinary bladder is collapsed. Stomach/Bowel: Postsurgical changes of total colectomy with a right lower quadrant ileostomy. There is no bowel obstruction or active inflammation. Vascular/Lymphatic: Moderate aortoiliac atherosclerotic disease. The IVC is unremarkable. No portal venous gas. There is no adenopathy. Reproductive: The prostate gland is enlarged measuring 5.3 cm in transverse axial diameter. The seminal vesicles are symmetric. Other: None Musculoskeletal: Degenerative changes of the spine. No acute osseous pathology. IMPRESSION: 1. No acute intra-abdominal or pelvic pathology. 2. Cirrhosis. 3. Postsurgical changes of total colectomy with a right lower quadrant ileostomy. No bowel obstruction. 4.  Aortic Atherosclerosis (ICD10-I70.0). Electronically Signed   By: Anner Crete M.D.   On: 12/18/2022 22:49      Impression / Plan:   Alexander Cunningham is a 72 y.o. 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 effective 5 AM tomorrow for EGD and ileoscopy If the source of bleeding is from peristomal varices, there are multiple strategies to treat a variceal  ostomy bleed including manual pressure or pressure dressings, suture ligation, sclerotherapy, intravascular coil, or glue embolization), portal vein decompression with TIPS, and liver transplant  I have discussed alternative options, risks & benefits,  which include, but are not limited to, bleeding, infection, perforation,respiratory complication & drug reaction.  The patient agrees with this plan & written consent will be obtained.    Thank you for involving me in the care of this patient.  Patient has to follow-up with Dr. Virgina Jock upon discharge    LOS: 0 days   Sherri Sear, MD  12/19/2022, 5:16 PM    Note: This dictation was prepared with Dragon dictation along with smaller phrase technology. Any transcriptional errors that result from this process are unintentional.

## 2022-12-20 ENCOUNTER — Encounter: Payer: Self-pay | Admitting: Internal Medicine

## 2022-12-20 ENCOUNTER — Inpatient Hospital Stay: Payer: Medicare Other | Admitting: General Practice

## 2022-12-20 ENCOUNTER — Encounter
Admission: EM | Disposition: A | Discharge status: Home / Self Care | Payer: Self-pay | Source: Home / Self Care | Attending: Internal Medicine

## 2022-12-20 DIAGNOSIS — K7030 Alcoholic cirrhosis of liver without ascites: Secondary | ICD-10-CM | POA: Diagnosis not present

## 2022-12-20 DIAGNOSIS — Z932 Ileostomy status: Secondary | ICD-10-CM | POA: Diagnosis not present

## 2022-12-20 DIAGNOSIS — K9401 Colostomy hemorrhage: Secondary | ICD-10-CM | POA: Diagnosis not present

## 2022-12-20 DIAGNOSIS — D62 Acute posthemorrhagic anemia: Secondary | ICD-10-CM | POA: Diagnosis not present

## 2022-12-20 HISTORY — PX: ESOPHAGOGASTRODUODENOSCOPY (EGD) WITH PROPOFOL: SHX5813

## 2022-12-20 HISTORY — PX: ILEOSCOPY: SHX5434

## 2022-12-20 LAB — CBC
HCT: 21.9 % — ABNORMAL LOW (ref 39.0–52.0)
Hemoglobin: 7.3 g/dL — ABNORMAL LOW (ref 13.0–17.0)
MCH: 30.7 pg (ref 26.0–34.0)
MCHC: 33.3 g/dL (ref 30.0–36.0)
MCV: 92 fL (ref 80.0–100.0)
Platelets: 72 10*3/uL — ABNORMAL LOW (ref 150–400)
RBC: 2.38 MIL/uL — ABNORMAL LOW (ref 4.22–5.81)
RDW: 15.4 % (ref 11.5–15.5)
WBC: 3.7 10*3/uL — ABNORMAL LOW (ref 4.0–10.5)
nRBC: 0 % (ref 0.0–0.2)

## 2022-12-20 LAB — GLUCOSE, CAPILLARY
Glucose-Capillary: 144 mg/dL — ABNORMAL HIGH (ref 70–99)
Glucose-Capillary: 136 mg/dL — ABNORMAL HIGH (ref 70–99)
Glucose-Capillary: 133 mg/dL — ABNORMAL HIGH (ref 70–99)
Glucose-Capillary: 121 mg/dL — ABNORMAL HIGH (ref 70–99)
Glucose-Capillary: 130 mg/dL — ABNORMAL HIGH (ref 70–99)
Glucose-Capillary: 249 mg/dL — ABNORMAL HIGH (ref 70–99)

## 2022-12-20 LAB — BASIC METABOLIC PANEL
Anion gap: 4 — ABNORMAL LOW (ref 5–15)
BUN: 26 mg/dL — ABNORMAL HIGH (ref 8–23)
CO2: 16 mmol/L — ABNORMAL LOW (ref 22–32)
Calcium: 8.2 mg/dL — ABNORMAL LOW (ref 8.9–10.3)
Chloride: 122 mmol/L — ABNORMAL HIGH (ref 98–111)
Creatinine, Ser: 1.39 mg/dL — ABNORMAL HIGH (ref 0.61–1.24)
GFR, Estimated: 54 mL/min — ABNORMAL LOW (ref >60)
Glucose, Bld: 155 mg/dL — ABNORMAL HIGH (ref 70–99)
Potassium: 4.1 mmol/L (ref 3.5–5.1)
Sodium: 142 mmol/L (ref 135–145)

## 2022-12-20 LAB — FOLATE: Folate: 16.4 ng/mL (ref >5.9)

## 2022-12-20 LAB — FERRITIN: Ferritin: 101 ng/mL (ref 24–336)

## 2022-12-20 SURGERY — ESOPHAGOGASTRODUODENOSCOPY (EGD) WITH PROPOFOL
Anesthesia: General

## 2022-12-20 MED ORDER — LIDOCAINE HCL (PF) 2 % IJ SOLN
INTRAMUSCULAR | Status: AC
  Filled 2022-12-20: qty 5

## 2022-12-20 MED ORDER — PROPOFOL 10 MG/ML IV BOLUS
INTRAVENOUS | Status: DC | PRN
  Administered 2022-12-20: 10 mg via INTRAVENOUS
  Administered 2022-12-20: 100 mg via INTRAVENOUS
  Administered 2022-12-20 (×13): 10 mg via INTRAVENOUS

## 2022-12-20 MED ORDER — DEXMEDETOMIDINE HCL IN NACL 80 MCG/20ML IV SOLN
INTRAVENOUS | Status: DC | PRN
  Administered 2022-12-20: 8 ug via BUCCAL
  Administered 2022-12-20: 4 ug via BUCCAL

## 2022-12-20 MED ORDER — SODIUM CHLORIDE 0.9 % IV SOLN: INTRAVENOUS | Status: DC

## 2022-12-20 MED ORDER — PHENYLEPHRINE HCL (PRESSORS) 10 MG/ML IV SOLN
INTRAVENOUS | Status: DC | PRN
  Administered 2022-12-20: 80 ug via INTRAVENOUS

## 2022-12-20 MED ORDER — PROPOFOL 10 MG/ML IV BOLUS
INTRAVENOUS | Status: AC
  Filled 2022-12-20: qty 40

## 2022-12-20 MED ORDER — LIDOCAINE HCL (CARDIAC) PF 100 MG/5ML IV SOSY
PREFILLED_SYRINGE | INTRAVENOUS | Status: DC | PRN
  Administered 2022-12-20: 100 mg via INTRAVENOUS

## 2022-12-20 NOTE — Op Note (Signed)
[facility] Gastroenterology Patient Name: Alexander Cunningham Procedure Date: 12/20/2022 3:16 PM MRN: [id number] Account #: [id number] Date of Birth: [birth year] Admit Type: Inpatient Age: 72 Room: [facility] ENDO ROOM 1 Gender: Male Note Status: Finalized Instrument Name: Upper Endoscope [id number] Procedure:             Upper GI endoscopy Indications:           Acute post hemorrhagic anemia Providers:             [name] MD, MD Referring MD:          Youlanda Roys. Lovie Macadamia, MD (Referring MD) Medicines:             General Anesthesia Complications:         No immediate complications. Estimated blood loss: None. Procedure:             Pre-Anesthesia Assessment:                        - Prior to the procedure, a History and Physical was                         performed, and patient medications and allergies were                         reviewed. The patient is competent. The risks and                         benefits of the procedure and the sedation options and                         risks were discussed with the patient. All questions                         were answered and informed consent was obtained.                         Patient identification and proposed procedure were                         verified by the physician, the nurse, the                         anesthesiologist, the anesthetist and the technician                         in the pre-procedure area in the procedure room in the                         endoscopy suite. Mental Status Examination: alert and                         oriented. Airway Examination: normal oropharyngeal                         airway and neck mobility. Respiratory Examination:                         clear to auscultation. CV Examination: normal.  Prophylactic Antibiotics: The patient does not require                         prophylactic antibiotics. Prior Anticoagulants: The                          patient has taken no anticoagulant or antiplatelet                         agents. ASA Grade Assessment: III - A patient with                         severe systemic disease. After reviewing the risks and                         benefits, the patient was deemed in satisfactory                         condition to undergo the procedure. The anesthesia                         plan was to use general anesthesia. Immediately prior                         to administration of medications, the patient was                         re-assessed for adequacy to receive sedatives. The                         heart rate, respiratory rate, oxygen saturations,                         blood pressure, adequacy of pulmonary ventilation, and                         response to care were monitored throughout the                         procedure. The physical status of the patient was                         re-assessed after the procedure.                        After obtaining informed consent, the endoscope was                         passed under direct vision. Throughout the procedure,                         the patient's blood pressure, pulse, and oxygen                         saturations were monitored continuously. The Endoscope                         was introduced through the mouth, and advanced to the  third part of duodenum. The upper GI endoscopy was                         accomplished without difficulty. The patient tolerated                         the procedure well. Findings:      Diffuse nodular mucosa was found in the duodenal bulb likley brunner's       gland hyperplasia. Biopsies were taken with a cold forceps for histology.      The second portion of the duodenum and third portion of the duodenum       were normal.      Mild, diffuse portal hypertensive gastropathy was found in the gastric       fundus and in the gastric body.      The cardia and gastric  fundus were normal on retroflexion.      Small (< 5 mm) varices were found in the lower third of the esophagus.      Esophagogastric landmarks were identified: the gastroesophageal junction       was found at 40 cm from the incisors. Impression:            - Nodular mucosa in the duodenal bulb. Biopsied.                        - Normal second portion of the duodenum and third                         portion of the duodenum.                        - Portal hypertensive gastropathy.                        - Small (< 5 mm) esophageal varices.                        - Esophagogastric landmarks identified. Recommendation:        - Await pathology results. Procedure Code(s):     --- Professional ---                        [phone], Esophagogastroduodenoscopy, flexible,                         transoral; with biopsy, single or multiple Diagnosis Code(s):     --- Professional ---                        K31.89, Other diseases of stomach and duodenum                        K76.6, Portal hypertension                        I85.00, Esophageal varices without bleeding                        D62, Acute posthemorrhagic anemia CPT copyright 2022 American [employer] Association. All rights reserved. The codes documented in this report are preliminary and upon coder review may  be revised to meet  current compliance requirements. Dr. Ulyess Mort [name] MD, MD 12/20/2022 4:18:11 PM This report has been signed electronically. Number of Addenda: 0 Note Initiated On: 12/20/2022 3:16 PM Estimated Blood Loss:  Estimated blood loss: none.      [facility]

## 2022-12-20 NOTE — Progress Notes (Signed)
Mobility Specialist - Progress Note   12/20/22 0958  Mobility  Activity Ambulated independently in hallway;Stood at bedside;Dangled on edge of bed  Level of Assistance Independent  Assistive Device None  Distance Ambulated (ft) 480 ft  Activity Response Tolerated well  Mobility Referral Yes  $Mobility charge 1 Mobility   Pt supine on RA upon arrival. Pt STS and ambulates in hallway indep. Pt returns to bed with needs in reach and visitor present.   [name]  Mobility Specialist  12/20/22 9:59 AM

## 2022-12-20 NOTE — Transfer of Care (Signed)
Immediate Anesthesia Transfer of Care Note  Patient: Alexander Cunningham  Procedure(s) Performed: ESOPHAGOGASTRODUODENOSCOPY (EGD) WITH PROPOFOL ILEOSCOPY THROUGH STOMA  Patient Location: PACU  Anesthesia Type:General  Level of Consciousness: drowsy  Airway & Oxygen Therapy: Patient Spontanous Breathing  Post-op Assessment: Report given to RN and Post -op Vital signs reviewed and stable  Post vital signs: Reviewed and stable  Last Vitals:  Vitals Value Taken Time  BP 106/69 1629  Temp 35.8 1629  Pulse 84 1629  Resp 18 1629  SpO2 98 1629    Last Pain:  Vitals:   12/20/22 1515  TempSrc: Temporal  PainSc: 0-No pain         Complications: No notable events documented.

## 2022-12-20 NOTE — Anesthesia Preprocedure Evaluation (Signed)
Anesthesia Evaluation  Patient identified by MRN, date of birth, ID band Patient awake    Reviewed: Allergy & Precautions, NPO status , Patient's Chart, lab work & pertinent test results  History of Anesthesia Complications Negative for: history of anesthetic complications  Airway Mallampati: III  TM Distance: <3 FB Neck ROM: full    Dental  (+) Missing, Poor Dentition   Pulmonary shortness of breath and with exertion, former smoker   Pulmonary exam normal        Cardiovascular hypertension, (-) angina +CHF  Normal cardiovascular exam     Neuro/Psych negative neurological ROS  negative psych ROS   GI/Hepatic Neg liver ROS, PUD,GERD  Controlled,,  Endo/Other  diabetes, Type 2    Renal/GU Renal disease  negative genitourinary   Musculoskeletal   Abdominal   Peds  Hematology negative hematology ROS (+)   Anesthesia Other Findings Past Medical History: No date: CHF (congestive heart failure) (HCC) No date: Cirrhosis (HCC) No date: Diabetes mellitus without complication (HCC) No date: Esophageal varices (HCC) No date: GERD (gastroesophageal reflux disease) No date: Portal hypertension (HCC) No date: Ulcerative colitis ([place])  Past Surgical History: 08/17/2021: ESOPHAGOGASTRODUODENOSCOPY; N/A     Comment:  Procedure: ESOPHAGOGASTRODUODENOSCOPY (EGD);  Surgeon:               Annamaria Helling, DO;  Location: [facility] ENDOSCOPY;                Service: Gastroenterology;  Laterality: N/A;  IDDM 08/30/2022: ESOPHAGOGASTRODUODENOSCOPY; N/A     Comment:  Procedure: ESOPHAGOGASTRODUODENOSCOPY (EGD);  Surgeon:               Annamaria Helling, DO;  Location: [facility] ENDOSCOPY;                Service: Gastroenterology;  Laterality: N/A; No date: HERNIA REPAIR No date: TOTAL COLECTOMY     Comment:  with end ileostomy  BMI    Body Mass Index: 29.56 kg/m      Reproductive/Obstetrics negative OB ROS                              Anesthesia Physical Anesthesia Plan  ASA: 3  Anesthesia Plan: General   Post-op Pain Management:    Induction: Intravenous  PONV Risk Score and Plan: Propofol infusion and TIVA  Airway Management Planned: Natural Airway and Nasal Cannula  Additional Equipment:   Intra-op Plan:   Post-operative Plan:   Informed Consent: I have reviewed the patients History and Physical, chart, labs and discussed the procedure including the risks, benefits and alternatives for the proposed anesthesia with the patient or authorized representative who has indicated his/her understanding and acceptance.     Dental Advisory Given  Plan Discussed with: Anesthesiologist, CRNA and Surgeon  Anesthesia Plan Comments: (Patient consented for risks of anesthesia including but not limited to:  - adverse reactions to medications - risk of airway placement if required - damage to eyes, teeth, lips or other oral mucosa - nerve damage due to positioning  - sore throat or hoarseness - Damage to heart, brain, nerves, lungs, other parts of body or loss of life  Patient voiced understanding.)       Anesthesia Quick Evaluation

## 2022-12-20 NOTE — Progress Notes (Signed)
12/20/2022  Subjective: No acute events overnight.  No further bleeding after initial silver nitrate treatment.  Hgb this morning is stable to 7.3.  He's scheduled for EGD and ileoscopy.  Denies any abdominal pain.  Vital signs: Temp:  [97.6 F (36.4 C)-98 F (36.7 C)] 97.6 F (36.4 C) (03/21 0735) Pulse Rate:  [65-101] 75 (03/21 0735) Resp:  [16-25] 16 (03/21 0735) BP: (112-150)/(49-60) 133/59 (03/21 0735) SpO2:  [98 %-100 %] 100 % (03/21 0735)   Intake/Output: 03/20 0701 - 03/21 0700 In: 1293.7 [P.O.:360; I.V.:933.7] Out: 0  Last BM Date : 12/20/22  Physical Exam: Constitutional:  No acute distress Abdomen:  soft, non-distended, non-tender.  Ileostomy with healthy mucosa, no active bleeding.  Labs:  Recent Labs    12/18/22 2220 12/19/22 0555 12/19/22 1530 12/20/22 0731  WBC 6.8  --   --  3.7*  HGB 8.3*   < > 7.5* 7.3*  HCT 25.5*  --   --  21.9*  PLT 130*  --   --  72*   < > = values in this interval not displayed.   Recent Labs    12/19/22 0555 12/20/22 0731  NA 140 142  K 4.4 4.1  CL 122* 122*  CO2 15* 16*  GLUCOSE 137* 155*  BUN 31* 26*  CREATININE 1.49* 1.39*  CALCIUM 8.2* 8.2*   Recent Labs    12/18/22 2220  LABPROT 18.6*  INR 1.6*    Imaging: No results found.  Assessment/Plan: This is a 72 y.o. male with ileostomy stoma varix  --No further bleeding since initial treatment with silver nitrate.  No acute surgical needs at this point. --Patient will have EGD and ileoscopy today to make sure no other source of bleeding.   --Continue management of his portal hypertension --Silver nitrate to stoma as needed.  I spent 35 minutes dedicated to the care of this patient on the date of this encounter to include pre-visit review of records, face-to-face time with the patient discussing diagnosis and management, and any post-visit coordination of care.  Melvyn Neth, [facility]

## 2022-12-20 NOTE — Op Note (Signed)
[facility] Gastroenterology Patient Name: Alexander Cunningham Procedure Date: 12/20/2022 3:16 PM MRN: [id number] Account #: [id number] Date of Birth: 1951/01/21 Admit Type: Inpatient Age: 72 Room: [facility] ENDO ROOM 1 Gender: Male Note Status: Finalized Instrument Name: Upper Endoscope [id number] Procedure:             Ileoscopy Indications:           History of total proctocolectomy, Ileostomy bleed Providers:             [name] MD, MD Referring MD:          Youlanda Roys. Lovie Macadamia, MD (Referring MD) Medicines:             General Anesthesia Complications:         No immediate complications. Estimated blood loss: None. Procedure:             Pre-Anesthesia Assessment:                        - Prior to the procedure, a History and Physical was                         performed, and patient medications and allergies were                         reviewed. The patient is competent. The risks and                         benefits of the procedure and the sedation options and                         risks were discussed with the patient. All questions                         were answered and informed consent was obtained.                         Patient identification and proposed procedure were                         verified by the physician, the nurse, the                         anesthesiologist, the anesthetist and the technician                         in the pre-procedure area in the procedure room in the                         endoscopy suite. Mental Status Examination: alert and                         oriented. Airway Examination: normal oropharyngeal                         airway and neck mobility. Respiratory Examination:                         clear to auscultation. CV Examination: normal.  Prophylactic Antibiotics: The patient does not require                         prophylactic antibiotics. Prior Anticoagulants: The                          patient has taken no anticoagulant or antiplatelet                         agents. ASA Grade Assessment: III - A patient with                         severe systemic disease. After reviewing the risks and                         benefits, the patient was deemed in satisfactory                         condition to undergo the procedure. The anesthesia                         plan was to use general anesthesia. Immediately prior                         to administration of medications, the patient was                         re-assessed for adequacy to receive sedatives. The                         heart rate, respiratory rate, oxygen saturations,                         blood pressure, adequacy of pulmonary ventilation, and                         response to care were monitored throughout the                         procedure. The physical status of the patient was                         re-assessed after the procedure.                        After I obtained informed consent, the scope was                         passed under direct vision. Throughout the procedure,                         the patient's blood pressure, pulse, and oxygen                         saturations were monitored continuously.After I                         obtained informed consent, the scope was passed under  direct vision. Throughout the procedure, the patient's                         blood pressure, pulse, and oxygen saturations were                         monitored continuously. The Endoscope was introduced                         through the ileostomy and advanced to the neo-terminal                         ileum. The ileoscopy was performed without difficulty.                         The patient tolerated the procedure well. The quality                         of the bowel preparation was adequate. Findings:      The neo-terminal ileum appeared normal. The stoma appeared  healthy       except for the area which was previously treated with silver nitrate Impression:            - The examined portion of the ileum was normal.                        - No specimens collected. Recommendation:        - Return patient to hospital ward for possible                         discharge same day.                        - Cardiac diet and diabetic (ADA) diet today.                        - Return to GI office at appointment to be scheduled                         with Dr Virgina Jock. Procedure Code(s):     --- Professional ---                        [phone], Ileoscopy, through stoma; diagnostic, including                         collection of specimen(s) by brushing or washing, when                         performed (separate procedure) Diagnosis Code(s):     --- Professional ---                        Z90.49, Acquired absence of other specified parts of                         digestive tract                        K94.11, Enterostomy hemorrhage CPT copyright 2022  American Medical Association. All rights reserved. The codes documented in this report are preliminary and upon coder review may  be revised to meet current compliance requirements. Dr. Ulyess Mort [name] MD, MD 12/20/2022 4:26:46 PM This report has been signed electronically. Number of Addenda: 0 Note Initiated On: 12/20/2022 3:16 PM Estimated Blood Loss:  Estimated blood loss: none.      [facility]

## 2022-12-20 NOTE — Anesthesia Postprocedure Evaluation (Signed)
Anesthesia Post Note  Patient: Alexander Cunningham  Procedure(s) Performed: ESOPHAGOGASTRODUODENOSCOPY (EGD) WITH PROPOFOL ILEOSCOPY THROUGH STOMA  Patient location during evaluation: Endoscopy Anesthesia Type: General Level of consciousness: awake and alert Pain management: pain level controlled Vital Signs Assessment: post-procedure vital signs reviewed and stable Respiratory status: spontaneous breathing, nonlabored ventilation, respiratory function stable and patient connected to nasal cannula oxygen Cardiovascular status: blood pressure returned to baseline and stable Postop Assessment: no apparent nausea or vomiting Anesthetic complications: no   No notable events documented.   Last Vitals:  Vitals:   12/20/22 1639 12/20/22 1649  BP: (!) 102/51 (!) 105/54  Pulse: 78 76  Resp: 18 18  Temp:    SpO2: 100% 100%    Last Pain:  Vitals:   12/20/22 1649  TempSrc:   PainSc: 0-No pain                 Precious Haws Latese Dufault

## 2022-12-20 NOTE — Progress Notes (Signed)
PROGRESS NOTE    Alexander Cunningham  [id number] DOB: [birth year] DOA: 12/18/2022 PCP: Juluis Pitch, MD  Assessment & Plan:   Principal Problem:   ABLA (acute blood loss anemia) Active Problems:   Bleeding from ileostomy stoma (HCC)   Thrombocytopenia ([place])   Uncontrolled type 2 diabetes mellitus with hyperglycemia, with long-term current use of insulin (HCC)   Metabolic acidosis   Stage 3a chronic kidney disease ([place])   Essential hypertension   Blood loss anemia  Assessment and Plan: Acute blood loss anemia: etiology unclear, possibly recurrent bleeding from ileostomy stoma vs esophageal varices as pt hx of cirrhosis. Hx of colectomy/ileostomy 2016 secondary to hx of ulcerative colitis. Hx of bleeding from stomal site 3 years prior requiring sutures & again on 3/18 improving w/ compressive dressing.  Silver nitrate to stoma prn as per gen surg. S/p 1 unit of pRBCs transfused so far. Will go for EGD & ileoscopy today as per GI. CT abd/pelvis shows no acute intra abd/pelvic abnormality but cirrhosis. GI & general surg following and recs apprec   Chronic thrombocytopenia: likely secondary to cirrhosis. Will continue to monitor    Metabolic acidosis: trending up slowly. Continue on po sodium bicarb  Alcoholic cirrhosis: holding home dose of propranolol secondary to low normal BP  DM2: HbA1c 8.0, poorly controlled. Continue on SSI w/ accuchecks  HTN: holding all home anti-HTN as BP is low normal. Continue to monitor closely  CKDIIIa: Cr is at baseline. Will continue to monitor         DVT prophylaxis: SCDs Code Status: full  Family Communication:  Disposition Plan: depends on PT/OT recs (not consulted yet secondary procedures planned for today)  Level of care: Progressive  Status is: Inpatient Remains inpatient appropriate because: severity of illness      Consultants:  Gen surg GI   Procedures:  Antimicrobials:   Subjective: Pt c/o  malaise  Objective: Vitals:   12/20/22 0336 12/20/22 0735 12/20/22 1203 12/20/22 1515  BP: (!) 126/51 (!) 133/59 129/66 (!) 151/67  Pulse: 78 75 79   Resp: 17 16 16    Temp: 97.7 F (36.5 C) 97.6 F (36.4 C) 97.6 F (36.4 C) (!) 97.4 F (36.3 C)  TempSrc: Oral   Temporal  SpO2: 99% 100% 100% 100%  Weight:      Height:        Intake/Output Summary (Last 24 hours) at 12/20/2022 1524 Last data filed at 12/20/2022 0300 Gross per 24 hour  Intake 1293.68 ml  Output 0 ml  Net 1293.68 ml   Filed Weights   12/18/22 2206  Weight: 93.4 kg    Examination:  General exam: Appears comfortable Respiratory system: clear breath sounds b/l  Cardiovascular system: S1/S2+. No rubs or clicks Gastrointestinal system: Abd is soft, NT, ND & hypoactive bowel sounds. Central nervous system: Alert and awake. Moves all extremities  Psychiatry: judgement and insight appears at baseline. Appropriate mood and affect.     Data Reviewed: I have personally reviewed following labs and imaging studies  CBC: Recent Labs  Lab 12/18/22 2220 12/19/22 0555 12/19/22 1530 12/20/22 0731  WBC 6.8  --   --  3.7*  NEUTROABS 3.4  --   --   --   HGB 8.3* 6.9* 7.5* 7.3*  HCT 25.5*  --   --  21.9*  MCV 97.3  --   --  92.0  PLT 130*  --   --  72*   Basic Metabolic Panel: Recent Labs  Lab 12/18/22 2220  12/19/22 0555 12/20/22 0731  NA 137 140 142  K 5.0 4.4 4.1  CL 117* 122* 122*  CO2 17* 15* 16*  GLUCOSE 290* 137* 155*  BUN 28* 31* 26*  CREATININE 1.61* 1.49* 1.39*  CALCIUM 8.2* 8.2* 8.2*   GFR: Estimated Creatinine Clearance: 55.2 mL/min (A) (by C-G formula based on SCr of 1.39 mg/dL (H)). Liver Function Tests: Recent Labs  Lab 12/18/22 2220  AST 29  ALT 18  ALKPHOS 80  BILITOT 0.6  PROT 5.0*  ALBUMIN 2.6*   No results for input(s): "LIPASE", "AMYLASE" in the last 168 hours. No results for input(s): "AMMONIA" in the last 168 hours. Coagulation Profile: Recent Labs  Lab  12/18/22 2220  INR 1.6*   Cardiac Enzymes: No results for input(s): "CKTOTAL", "CKMB", "CKMBINDEX", "TROPONINI" in the last 168 hours. BNP (last 3 results) No results for input(s): "PROBNP" in the last 8760 hours. HbA1C: No results for input(s): "HGBA1C" in the last 72 hours. CBG: Recent Labs  Lab 12/19/22 2040 12/20/22 0300 12/20/22 0735 12/20/22 1204 12/20/22 1517  GLUCAP 230* 144* 136* 133* 121*   Lipid Profile: No results for input(s): "CHOL", "HDL", "LDLCALC", "TRIG", "CHOLHDL", "LDLDIRECT" in the last 72 hours. Thyroid Function Tests: No results for input(s): "TSH", "T4TOTAL", "FREET4", "T3FREE", "THYROIDAB" in the last 72 hours. Anemia Panel: No results for input(s): "VITAMINB12", "FOLATE", "FERRITIN", "TIBC", "IRON", "RETICCTPCT" in the last 72 hours. Sepsis Labs: Recent Labs  Lab 12/18/22 2220  LATICACIDVEN 1.5    No results found for this or any previous visit (from the past 240 hour(s)).       Radiology Studies: CT ABDOMEN PELVIS WO CONTRAST  Result Date: 12/18/2022 CLINICAL DATA:  Abdominal pain.  Lower GI bleed. EXAM: CT ABDOMEN AND PELVIS WITHOUT CONTRAST TECHNIQUE: Multidetector CT imaging of the abdomen and pelvis was performed following the standard protocol without IV contrast. RADIATION DOSE REDUCTION: This exam was performed according to the departmental dose-optimization program which includes automated exposure control, adjustment of the mA and/or kV according to patient size and/or use of iterative reconstruction technique. COMPARISON:  CT abdomen pelvis 03/05/2022. FINDINGS: Evaluation of this exam is limited in the absence of intravenous contrast. Lower chest: Diffuse chronic interstitial coarsening of the lung bases. The visualized lung bases are otherwise clear. No intra-abdominal free air or free fluid. Hepatobiliary: There is irregular appearance of the liver contour in keeping with cirrhosis. The gallbladder is not visualized, likely surgically  absent. Pancreas: Unremarkable. No pancreatic ductal dilatation or surrounding inflammatory changes. Spleen: Normal in size without focal abnormality. Adrenals/Urinary Tract: The adrenal glands are unremarkable. There is no hydronephrosis or nephrolithiasis on either side. The visualized ureters appear unremarkable. The urinary bladder is collapsed. Stomach/Bowel: Postsurgical changes of total colectomy with a right lower quadrant ileostomy. There is no bowel obstruction or active inflammation. Vascular/Lymphatic: Moderate aortoiliac atherosclerotic disease. The IVC is unremarkable. No portal venous gas. There is no adenopathy. Reproductive: The prostate gland is enlarged measuring 5.3 cm in transverse axial diameter. The seminal vesicles are symmetric. Other: None Musculoskeletal: Degenerative changes of the spine. No acute osseous pathology. IMPRESSION: 1. No acute intra-abdominal or pelvic pathology. 2. Cirrhosis. 3. Postsurgical changes of total colectomy with a right lower quadrant ileostomy. No bowel obstruction. 4.  Aortic Atherosclerosis (ICD10-I70.0). Electronically Signed   By: Anner Crete M.D.   On: 12/18/2022 22:49        Scheduled Meds:  [MAR Hold] insulin aspart  0-15 Units Subcutaneous TID WC   [MAR Hold] insulin aspart  0-5 Units Subcutaneous QHS   [MAR Hold] pantoprazole  40 mg Oral Daily   [MAR Hold] sodium bicarbonate  650 mg Oral BID   [MAR Hold] tamsulosin  0.4 mg Oral Daily   Continuous Infusions:  sodium chloride 75 mL/hr at 12/20/22 0249   sodium chloride 20 mL/hr at 12/20/22 1522     LOS: 1 day    Time spent: 35  mins    Wyvonnia Dusky, MD [facility] Pager [phone]  If 7PM-7AM, please contact night-coverage [email or web address] 12/20/2022, 3:24 PM

## 2022-12-21 ENCOUNTER — Encounter: Payer: Self-pay | Admitting: Gastroenterology

## 2022-12-21 DIAGNOSIS — K9401 Colostomy hemorrhage: Secondary | ICD-10-CM | POA: Diagnosis not present

## 2022-12-21 DIAGNOSIS — K7030 Alcoholic cirrhosis of liver without ascites: Secondary | ICD-10-CM | POA: Diagnosis not present

## 2022-12-21 DIAGNOSIS — D62 Acute posthemorrhagic anemia: Secondary | ICD-10-CM | POA: Diagnosis not present

## 2022-12-21 DIAGNOSIS — Z932 Ileostomy status: Secondary | ICD-10-CM | POA: Diagnosis not present

## 2022-12-21 LAB — BASIC METABOLIC PANEL
Anion gap: 9 (ref 5–15)
BUN: 22 mg/dL (ref 8–23)
CO2: 18 mmol/L — ABNORMAL LOW (ref 22–32)
Calcium: 8.3 mg/dL — ABNORMAL LOW (ref 8.9–10.3)
Chloride: 118 mmol/L — ABNORMAL HIGH (ref 98–111)
Creatinine, Ser: 1.35 mg/dL — ABNORMAL HIGH (ref 0.61–1.24)
GFR, Estimated: 56 mL/min — ABNORMAL LOW (ref >60)
Glucose, Bld: 148 mg/dL — ABNORMAL HIGH (ref 70–99)
Potassium: 4 mmol/L (ref 3.5–5.1)
Sodium: 145 mmol/L (ref 135–145)

## 2022-12-21 LAB — CBC
HCT: 19.5 % — ABNORMAL LOW (ref 39.0–52.0)
Hemoglobin: 6.5 g/dL — ABNORMAL LOW (ref 13.0–17.0)
MCH: 30.8 pg (ref 26.0–34.0)
MCHC: 33.3 g/dL (ref 30.0–36.0)
MCV: 92.4 fL (ref 80.0–100.0)
Platelets: 62 10*3/uL — ABNORMAL LOW (ref 150–400)
RBC: 2.11 MIL/uL — ABNORMAL LOW (ref 4.22–5.81)
RDW: 15 % (ref 11.5–15.5)
WBC: 3.2 10*3/uL — ABNORMAL LOW (ref 4.0–10.5)
nRBC: 0 % (ref 0.0–0.2)

## 2022-12-21 LAB — PREPARE RBC (CROSSMATCH)

## 2022-12-21 LAB — GLUCOSE, CAPILLARY
Glucose-Capillary: 154 mg/dL — ABNORMAL HIGH (ref 70–99)
Glucose-Capillary: 187 mg/dL — ABNORMAL HIGH (ref 70–99)
Glucose-Capillary: 253 mg/dL — ABNORMAL HIGH (ref 70–99)
Glucose-Capillary: 225 mg/dL — ABNORMAL HIGH (ref 70–99)

## 2022-12-21 LAB — HEMOGLOBIN AND HEMATOCRIT, BLOOD
HCT: 24.4 % — ABNORMAL LOW (ref 39.0–52.0)
Hemoglobin: 8 g/dL — ABNORMAL LOW (ref 13.0–17.0)

## 2022-12-21 MED ORDER — PROPRANOLOL HCL 20 MG PO TABS
10.0000 mg | ORAL_TABLET | Freq: Two times a day (BID) | ORAL | Status: DC
  Administered 2022-12-21 – 2022-12-22 (×2): 10 mg via ORAL
  Filled 2022-12-21 (×2): qty 1

## 2022-12-21 MED ORDER — SODIUM CHLORIDE 0.9% IV SOLUTION: Freq: Once | INTRAVENOUS | Status: AC

## 2022-12-21 NOTE — Progress Notes (Signed)
PROGRESS NOTE    Alexander Cunningham  [id number] DOB: October 22, 1950 DOA: 12/18/2022 PCP: Juluis Pitch, MD  Assessment & Plan:   Principal Problem:   ABLA (acute blood loss anemia) Active Problems:   Bleeding from ileostomy stoma (HCC)   Thrombocytopenia ([place])   Uncontrolled type 2 diabetes mellitus with hyperglycemia, with long-term current use of insulin (HCC)   Metabolic acidosis   Stage 3a chronic kidney disease ([place])   Essential hypertension   Blood loss anemia   Ileostomy status (HCC)  Assessment and Plan: Acute blood loss anemia: etiology unclear, possibly recurrent bleeding from ileostomy stoma vs esophageal varices as pt hx of cirrhosis. Hb 6.5 today so 1 unit of pRBCs given. Hx of colectomy/ileostomy 2016 secondary to hx of ulcerative colitis. Hx of bleeding from stomal site 3 years prior requiring sutures & again on 3/18 improving w/ compressive dressing.  Silver nitrate to stoma prn as per gen surg. S/p 2 unit of pRBCs transfused so far. S/p EGD which showed nodular mucosa in the duodenal bulb which was biopsied & portal hypertensive gastropathy, small esophageal varices. Ileoscopy showed normal neo-terminal ileum & stoma appeared healthy except for area which was previously treated w/ silver nitrate. CT abd/pelvis shows no acute intra abd/pelvic abnormality but cirrhosis. GI & general surg recs apprec   Chronic thrombocytopenia: likely secondary to cirrhosis   Metabolic acidosis: continues to trend up. Continue on po sodium bicarb   Alcoholic cirrhosis: restarted home dose of propranolol  DM2: HbA1c 8.0, poorly controlled. Continue on SSI w/ accuchecks  HTN: restated home dose of propranolol  CKDIIIa: Cr is at baseline. Will continue to monitor         DVT prophylaxis: SCDs Code Status: full  Family Communication:  Disposition Plan: likely d/c home tomorrow if H&H are stable and/or trending up  Level of care: Progressive  Status is: Inpatient Remains  inpatient appropriate because: severity of illness      Consultants:  Gen surg GI   Procedures:  Antimicrobials:   Subjective: Pt c/o fatigue   Objective: Vitals:   12/20/22 2343 12/21/22 0833 12/21/22 0834 12/21/22 0834  BP: (!) 143/60 (!) 143/60 (!) 143/60 (!) 143/60  Pulse: 81 80 82 80  Resp: 18 18 18 18   Temp: 98.7 F (37.1 C) 98.4 F (36.9 C) 98.4 F (36.9 C) 98.4 F (36.9 C)  TempSrc:  Oral Oral Oral  SpO2: 100% 100% 100% 100%  Weight:      Height:        Intake/Output Summary (Last 24 hours) at 12/21/2022 0857 Last data filed at 12/20/2022 2300 Gross per 24 hour  Intake 900 ml  Output --  Net 900 ml   Filed Weights   12/18/22 2206  Weight: 93.4 kg    Examination:  General exam: Appears calm & comfortable  Respiratory system: clear breath sounds b/l Cardiovascular system: S1 & S2+. No rubs or clicks  Gastrointestinal system: Abd is soft, NT, ND & hypoactive bowel sounds Central nervous system: Alert and awake. Moves all extremities  Psychiatry: judgement and insight appears at baseline. Appropriate mood and affect    Data Reviewed: I have personally reviewed following labs and imaging studies  CBC: Recent Labs  Lab 12/18/22 2220 12/19/22 0555 12/19/22 1530 12/20/22 0731 12/21/22 0613  WBC 6.8  --   --  3.7* 3.2*  NEUTROABS 3.4  --   --   --   --   HGB 8.3* 6.9* 7.5* 7.3* 6.5*  HCT 25.5*  --   --  21.9* 19.5*  MCV 97.3  --   --  92.0 92.4  PLT 130*  --   --  72* 62*   Basic Metabolic Panel: Recent Labs  Lab 12/18/22 2220 12/19/22 0555 12/20/22 0731 12/21/22 0613  NA 137 140 142 145  K 5.0 4.4 4.1 4.0  CL 117* 122* 122* 118*  CO2 17* 15* 16* 18*  GLUCOSE 290* 137* 155* 148*  BUN 28* 31* 26* 22  CREATININE 1.61* 1.49* 1.39* 1.35*  CALCIUM 8.2* 8.2* 8.2* 8.3*   GFR: Estimated Creatinine Clearance: 56.8 mL/min (A) (by C-G formula based on SCr of 1.35 mg/dL (H)). Liver Function Tests: Recent Labs  Lab 12/18/22 2220  AST 29   ALT 18  ALKPHOS 80  BILITOT 0.6  PROT 5.0*  ALBUMIN 2.6*   No results for input(s): "LIPASE", "AMYLASE" in the last 168 hours. No results for input(s): "AMMONIA" in the last 168 hours. Coagulation Profile: Recent Labs  Lab 12/18/22 2220  INR 1.6*   Cardiac Enzymes: No results for input(s): "CKTOTAL", "CKMB", "CKMBINDEX", "TROPONINI" in the last 168 hours. BNP (last 3 results) No results for input(s): "PROBNP" in the last 8760 hours. HbA1C: No results for input(s): "HGBA1C" in the last 72 hours. CBG: Recent Labs  Lab 12/20/22 1204 12/20/22 1517 12/20/22 1815 12/20/22 2041 12/21/22 0830  GLUCAP 133* 121* 130* 249* 154*   Lipid Profile: No results for input(s): "CHOL", "HDL", "LDLCALC", "TRIG", "CHOLHDL", "LDLDIRECT" in the last 72 hours. Thyroid Function Tests: No results for input(s): "TSH", "T4TOTAL", "FREET4", "T3FREE", "THYROIDAB" in the last 72 hours. Anemia Panel: Recent Labs    12/20/22 0731  FOLATE 16.4  FERRITIN 101   Sepsis Labs: Recent Labs  Lab 12/18/22 2220  LATICACIDVEN 1.5    No results found for this or any previous visit (from the past 240 hour(s)).       Radiology Studies: No results found.      Scheduled Meds:  sodium chloride   Intravenous Once   insulin aspart  0-15 Units Subcutaneous TID WC   insulin aspart  0-5 Units Subcutaneous QHS   pantoprazole  40 mg Oral Daily   sodium bicarbonate  650 mg Oral BID   tamsulosin  0.4 mg Oral Daily   Continuous Infusions:     LOS: 2 days    Time spent: 35  mins    Wyvonnia Dusky, MD [facility] Pager [phone]  If 7PM-7AM, please contact night-coverage [email or web address] 12/21/2022, 8:57 AM

## 2022-12-21 NOTE — Progress Notes (Signed)
Alexander Darby, MD [address]  [place]  [place], [place] [address]  Main: [phone]  Fax: [phone] Pager: [phone]   Subjective: No acute events overnight, no further episodes of bleeding.  Patient underwent EGD and ileoscopy yesterday.  He is tolerating diet well.  His hemoglobin dropped to 6.5, currently receiving blood transfusion   Objective: Vital signs in last 24 hours: Vitals:   12/21/22 1001 12/21/22 1017 12/21/22 1032 12/21/22 1229  BP: (!) 152/55  (!) 121/58 (!) 165/65  Pulse: 92  86 93  Resp:  20 20 16   Temp: 97.9 F (36.6 C)  97.9 F (36.6 C) 98 F (36.7 C)  TempSrc: Oral   Oral  SpO2: 100%   100%  Weight:      Height:       Weight change:   Intake/Output Summary (Last 24 hours) at 12/21/2022 1324 Last data filed at 12/21/2022 1104 Gross per 24 hour  Intake 1140 ml  Output --  Net 1140 ml     Exam: Heart:: Regular rate and rhythm, S1S2 present, or without murmur or extra heart sounds Lungs: normal and clear to auscultation Abdomen: soft, nontender, normal bowel sounds   Lab Results:    Latest Ref Rng & Units 12/21/2022    6:13 AM 12/20/2022    7:31 AM 12/19/2022    3:30 PM  CBC  WBC 4.0 - 10.5 K/uL 3.2  3.7    Hemoglobin 13.0 - 17.0 g/dL 6.5  7.3  7.5   Hematocrit 39.0 - 52.0 % 19.5  21.9    Platelets 150 - 400 K/uL 62  72        Latest Ref Rng & Units 12/21/2022    6:13 AM 12/20/2022    7:31 AM 12/19/2022    5:55 AM  CMP  Glucose 70 - 99 mg/dL 148  155  137   BUN 8 - 23 mg/dL 22  26  31    Creatinine 0.61 - 1.24 mg/dL 1.35  1.39  1.49   Sodium 135 - 145 mmol/L 145  142  140   Potassium 3.5 - 5.1 mmol/L 4.0  4.1  4.4   Chloride 98 - 111 mmol/L 118  122  122   CO2 22 - 32 mmol/L 18  16  15    Calcium 8.9 - 10.3 mg/dL 8.3  8.2  8.2     Micro Results: No results found for this or any previous visit (from the past 240 hour(s)). Studies/Results: No results found. Medications: I have reviewed the patient's current  medications. Prior to Admission:  Medications Prior to Admission  Medication Sig Dispense Refill Last Dose   ferrous sulfate 325 (65 FE) MG tablet Take 325 mg by mouth every morning.   12/18/2022   gabapentin (NEURONTIN) 300 MG capsule Take 300 mg by mouth 3 (three) times daily.   12/18/2022   insulin aspart (NOVOLOG) 100 UNIT/ML FlexPen Inject 15 Units into the skin in the morning, at noon, and at bedtime.   12/18/2022   Insulin Glargine (BASAGLAR KWIKPEN) 100 UNIT/ML Inject 30 Units into the skin See admin instructions. Inject 50u under the skin every morning and inject 40u under the skin every night (Patient taking differently: Inject 40-50 Units into the skin See admin instructions. Inject 50u under the skin every morning and inject 40u under the skin every night)   12/18/2022 at 0800   magnesium oxide (MAG-OX) 400 MG tablet Take 1 tablet by mouth 2 (two) times daily.  12/18/2022   omeprazole (PRILOSEC) 40 MG capsule Take 40 mg by mouth daily.   12/18/2022   propranolol (INDERAL) 10 MG tablet Take 10 mg by mouth 2 (two) times daily.   12/18/2022   tamsulosin (FLOMAX) 0.4 MG CAPS capsule Take 1 capsule (0.4 mg total) by mouth daily. 30 capsule 11 12/18/2022   lisinopril (ZESTRIL) 5 MG tablet Take 5 mg by mouth daily. (Patient not taking: Reported on 12/19/2022)   Not Taking   sodium bicarbonate 650 MG tablet Take 650 mg by mouth 4 (four) times daily. (Patient not taking: Reported on 12/19/2022)   Not Taking   Scheduled:  insulin aspart  0-15 Units Subcutaneous TID WC   insulin aspart  0-5 Units Subcutaneous QHS   pantoprazole  40 mg Oral Daily   sodium bicarbonate  650 mg Oral BID   tamsulosin  0.4 mg Oral Daily   Continuous: [id number] **OR** acetaminophen, ondansetron **OR** ondansetron (ZOFRAN) IV Anti-infectives (From admission, onward)    None      Scheduled Meds:  insulin aspart  0-15 Units Subcutaneous TID WC   insulin aspart  0-5 Units Subcutaneous QHS   pantoprazole  40 mg  Oral Daily   sodium bicarbonate  650 mg Oral BID   tamsulosin  0.4 mg Oral Daily   Continuous Infusions: PRN Meds:.acetaminophen **OR** acetaminophen, ondansetron **OR** ondansetron (ZOFRAN) IV   Assessment: Principal Problem:   ABLA (acute blood loss anemia) Active Problems:   Stage 3a chronic kidney disease (HCC)   Essential hypertension   Uncontrolled type 2 diabetes mellitus with hyperglycemia, with long-term current use of insulin (HCC)   Metabolic acidosis   Thrombocytopenia (HCC)   Bleeding from ileostomy stoma ([place])   Blood loss anemia   Ileostomy status ([place])  Alexander Cunningham is a 72 y.o. 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 Recommend evaluation by hematology/oncology if all his cell lines continue to drop in the absence of active GI bleed GI will sign off today, please call us back with questions Patient has to follow-up with Dr. Virgina Jock upon discharge    LOS: 2 days   Alexander Cunningham 12/21/2022, 1:24 PM

## 2022-12-22 ENCOUNTER — Other Ambulatory Visit: Payer: Self-pay

## 2022-12-22 ENCOUNTER — Observation Stay
Admission: EM | Admit: 2022-12-22 | Discharge: 2022-12-23 | Disposition: A | Discharge status: Home / Self Care | Payer: Medicare Other | Attending: Internal Medicine | Admitting: Internal Medicine

## 2022-12-22 DIAGNOSIS — I10 Essential (primary) hypertension: Secondary | ICD-10-CM | POA: Diagnosis present

## 2022-12-22 DIAGNOSIS — I130 Hypertensive heart and chronic kidney disease with heart failure and stage 1 through stage 4 chronic kidney disease, or unspecified chronic kidney disease: Secondary | ICD-10-CM | POA: Insufficient documentation

## 2022-12-22 DIAGNOSIS — D693 Immune thrombocytopenic purpura: Secondary | ICD-10-CM | POA: Diagnosis not present

## 2022-12-22 DIAGNOSIS — Z789 Other specified health status: Secondary | ICD-10-CM | POA: Insufficient documentation

## 2022-12-22 DIAGNOSIS — Z87891 Personal history of nicotine dependence: Secondary | ICD-10-CM | POA: Insufficient documentation

## 2022-12-22 DIAGNOSIS — Z79899 Other long term (current) drug therapy: Secondary | ICD-10-CM | POA: Insufficient documentation

## 2022-12-22 DIAGNOSIS — Z8719 Personal history of other diseases of the digestive system: Secondary | ICD-10-CM

## 2022-12-22 DIAGNOSIS — I509 Heart failure, unspecified: Secondary | ICD-10-CM | POA: Insufficient documentation

## 2022-12-22 DIAGNOSIS — K9401 Colostomy hemorrhage: Principal | ICD-10-CM | POA: Insufficient documentation

## 2022-12-22 DIAGNOSIS — E1165 Type 2 diabetes mellitus with hyperglycemia: Secondary | ICD-10-CM | POA: Diagnosis not present

## 2022-12-22 DIAGNOSIS — K922 Gastrointestinal hemorrhage, unspecified: Secondary | ICD-10-CM | POA: Diagnosis present

## 2022-12-22 DIAGNOSIS — N1831 Chronic kidney disease, stage 3a: Secondary | ICD-10-CM | POA: Insufficient documentation

## 2022-12-22 DIAGNOSIS — D62 Acute posthemorrhagic anemia: Secondary | ICD-10-CM | POA: Diagnosis not present

## 2022-12-22 DIAGNOSIS — D696 Thrombocytopenia, unspecified: Secondary | ICD-10-CM | POA: Insufficient documentation

## 2022-12-22 DIAGNOSIS — K7030 Alcoholic cirrhosis of liver without ascites: Secondary | ICD-10-CM | POA: Diagnosis not present

## 2022-12-22 DIAGNOSIS — K5190 Ulcerative colitis, unspecified, without complications: Secondary | ICD-10-CM | POA: Diagnosis present

## 2022-12-22 DIAGNOSIS — Z794 Long term (current) use of insulin: Secondary | ICD-10-CM | POA: Insufficient documentation

## 2022-12-22 LAB — BPAM RBC
Blood Product Expiration Date: 202404182359
Blood Product Expiration Date: 202404262359
ISSUE DATE / TIME: 202403200907
ISSUE DATE / TIME: 202403221008
Unit Type and Rh: 5100
Unit Type and Rh: 5100

## 2022-12-22 LAB — CBC WITH DIFFERENTIAL/PLATELET
Abs Immature Granulocytes: 0.04 10*3/uL (ref 0.00–0.07)
Basophils Absolute: 0.1 10*3/uL (ref 0.0–0.1)
Basophils Relative: 1 %
Eosinophils Absolute: 0.5 10*3/uL (ref 0.0–0.5)
Eosinophils Relative: 7 %
HCT: 32.4 % — ABNORMAL LOW (ref 39.0–52.0)
Hemoglobin: 10.3 g/dL — ABNORMAL LOW (ref 13.0–17.0)
Immature Granulocytes: 1 %
Lymphocytes Relative: 24 %
Lymphs Abs: 1.8 10*3/uL (ref 0.7–4.0)
MCH: 31.3 pg (ref 26.0–34.0)
MCHC: 31.8 g/dL (ref 30.0–36.0)
MCV: 98.5 fL (ref 80.0–100.0)
Monocytes Absolute: 0.5 10*3/uL (ref 0.1–1.0)
Monocytes Relative: 7 %
Neutro Abs: 4.3 10*3/uL (ref 1.7–7.7)
Neutrophils Relative %: 60 %
Platelets: 130 10*3/uL — ABNORMAL LOW (ref 150–400)
RBC: 3.29 MIL/uL — ABNORMAL LOW (ref 4.22–5.81)
RDW: 14.9 % (ref 11.5–15.5)
WBC: 7.3 10*3/uL (ref 4.0–10.5)
nRBC: 0 % (ref 0.0–0.2)

## 2022-12-22 LAB — TYPE AND SCREEN
ABO/RH(D): O POS
Antibody Screen: NEGATIVE
Unit division: 0
Unit division: 0

## 2022-12-22 LAB — CBC
HCT: 26.2 % — ABNORMAL LOW (ref 39.0–52.0)
Hemoglobin: 8.7 g/dL — ABNORMAL LOW (ref 13.0–17.0)
MCH: 30.6 pg (ref 26.0–34.0)
MCHC: 33.2 g/dL (ref 30.0–36.0)
MCV: 92.3 fL (ref 80.0–100.0)
Platelets: 100 10*3/uL — ABNORMAL LOW (ref 150–400)
RBC: 2.84 MIL/uL — ABNORMAL LOW (ref 4.22–5.81)
RDW: 14.6 % (ref 11.5–15.5)
WBC: 5.3 10*3/uL (ref 4.0–10.5)
nRBC: 0 % (ref 0.0–0.2)

## 2022-12-22 LAB — COMPREHENSIVE METABOLIC PANEL
ALT: 26 U/L (ref 0–44)
AST: 44 U/L — ABNORMAL HIGH (ref 15–41)
Albumin: 3.4 g/dL — ABNORMAL LOW (ref 3.5–5.0)
Alkaline Phosphatase: 79 U/L (ref 38–126)
Anion gap: 6 (ref 5–15)
BUN: 19 mg/dL (ref 8–23)
CO2: 17 mmol/L — ABNORMAL LOW (ref 22–32)
Calcium: 8.6 mg/dL — ABNORMAL LOW (ref 8.9–10.3)
Chloride: 116 mmol/L — ABNORMAL HIGH (ref 98–111)
Creatinine, Ser: 1.37 mg/dL — ABNORMAL HIGH (ref 0.61–1.24)
GFR, Estimated: 55 mL/min — ABNORMAL LOW (ref >60)
Glucose, Bld: 158 mg/dL — ABNORMAL HIGH (ref 70–99)
Potassium: 4.2 mmol/L (ref 3.5–5.1)
Sodium: 139 mmol/L (ref 135–145)
Total Bilirubin: 0.7 mg/dL (ref 0.3–1.2)
Total Protein: 6.6 g/dL (ref 6.5–8.1)

## 2022-12-22 LAB — BASIC METABOLIC PANEL
Anion gap: 5 (ref 5–15)
BUN: 19 mg/dL (ref 8–23)
CO2: 18 mmol/L — ABNORMAL LOW (ref 22–32)
Calcium: 8.4 mg/dL — ABNORMAL LOW (ref 8.9–10.3)
Chloride: 117 mmol/L — ABNORMAL HIGH (ref 98–111)
Creatinine, Ser: 1.3 mg/dL — ABNORMAL HIGH (ref 0.61–1.24)
GFR, Estimated: 58 mL/min — ABNORMAL LOW (ref >60)
Glucose, Bld: 166 mg/dL — ABNORMAL HIGH (ref 70–99)
Potassium: 3.9 mmol/L (ref 3.5–5.1)
Sodium: 140 mmol/L (ref 135–145)

## 2022-12-22 LAB — GLUCOSE, CAPILLARY
Glucose-Capillary: 177 mg/dL — ABNORMAL HIGH (ref 70–99)
Glucose-Capillary: 149 mg/dL — ABNORMAL HIGH (ref 70–99)

## 2022-12-22 MED ORDER — PROPRANOLOL HCL 10 MG PO TABS
10.0000 mg | ORAL_TABLET | Freq: Two times a day (BID) | ORAL | Status: DC
  Administered 2022-12-22 – 2022-12-23 (×2): 10 mg via ORAL
  Filled 2022-12-22 (×2): qty 1

## 2022-12-22 MED ORDER — ACETAMINOPHEN 650 MG RE SUPP: 650.0000 mg | Freq: Four times a day (QID) | RECTAL | Status: DC | PRN

## 2022-12-22 MED ORDER — INSULIN ASPART 100 UNIT/ML IJ SOLN: 0.0000 [IU] | Freq: Every day | INTRAMUSCULAR | Status: DC

## 2022-12-22 MED ORDER — GABAPENTIN 300 MG PO CAPS
300.0000 mg | ORAL_CAPSULE | Freq: Three times a day (TID) | ORAL | Status: DC
  Administered 2022-12-23: 300 mg via ORAL
  Filled 2022-12-22: qty 1

## 2022-12-22 MED ORDER — ONDANSETRON HCL 4 MG PO TABS: 4.0000 mg | ORAL_TABLET | Freq: Four times a day (QID) | ORAL | Status: DC | PRN

## 2022-12-22 MED ORDER — INSULIN ASPART 100 UNIT/ML IJ SOLN
0.0000 [IU] | Freq: Three times a day (TID) | INTRAMUSCULAR | Status: DC
  Administered 2022-12-23: 2 [IU] via SUBCUTANEOUS
  Filled 2022-12-22: qty 1

## 2022-12-22 MED ORDER — SODIUM CHLORIDE 0.9% FLUSH
3.0000 mL | Freq: Two times a day (BID) | INTRAVENOUS | Status: DC
  Administered 2022-12-22 – 2022-12-23 (×2): 3 mL via INTRAVENOUS

## 2022-12-22 MED ORDER — INSULIN ASPART 100 UNIT/ML IJ SOLN
8.0000 [IU] | Freq: Three times a day (TID) | INTRAMUSCULAR | Status: DC
  Administered 2022-12-23 (×2): 8 [IU] via SUBCUTANEOUS
  Filled 2022-12-22 (×2): qty 1

## 2022-12-22 MED ORDER — PANTOPRAZOLE SODIUM 40 MG PO TBEC
80.0000 mg | DELAYED_RELEASE_TABLET | Freq: Every day | ORAL | Status: DC
  Administered 2022-12-23: 80 mg via ORAL
  Filled 2022-12-22: qty 2

## 2022-12-22 MED ORDER — INSULIN ASPART 100 UNIT/ML FLEXPEN
8.0000 [IU] | PEN_INJECTOR | Freq: Three times a day (TID) | SUBCUTANEOUS | Status: DC
  Filled 2022-12-22: qty 3

## 2022-12-22 MED ORDER — TRANEXAMIC ACID FOR EPISTAXIS
500.0000 mg | Freq: Once | TOPICAL | Status: AC
  Administered 2022-12-22: 500 mg via TOPICAL
  Filled 2022-12-22: qty 10

## 2022-12-22 MED ORDER — POLYETHYLENE GLYCOL 3350 17 G PO PACK: 17.0000 g | PACK | Freq: Every day | ORAL | Status: DC | PRN

## 2022-12-22 MED ORDER — ONDANSETRON HCL 4 MG/2ML IJ SOLN: 4.0000 mg | Freq: Four times a day (QID) | INTRAMUSCULAR | Status: DC | PRN

## 2022-12-22 MED ORDER — ACETAMINOPHEN 325 MG PO TABS: 650.0000 mg | ORAL_TABLET | Freq: Four times a day (QID) | ORAL | Status: DC | PRN

## 2022-12-22 MED ORDER — INSULIN GLARGINE-YFGN 100 UNIT/ML ~~LOC~~ SOLN
25.0000 [IU] | Freq: Two times a day (BID) | SUBCUTANEOUS | Status: DC
  Administered 2022-12-23 (×2): 25 [IU] via SUBCUTANEOUS
  Filled 2022-12-22 (×3): qty 0.25

## 2022-12-22 MED ORDER — TRAZODONE HCL 50 MG PO TABS: 50.0000 mg | ORAL_TABLET | Freq: Every evening | ORAL | Status: DC | PRN

## 2022-12-22 MED ORDER — ENOXAPARIN SODIUM 40 MG/0.4ML IJ SOSY
40.0000 mg | PREFILLED_SYRINGE | INTRAMUSCULAR | Status: DC
  Filled 2022-12-22: qty 0.4

## 2022-12-22 MED ORDER — TAMSULOSIN HCL 0.4 MG PO CAPS
0.4000 mg | ORAL_CAPSULE | Freq: Every day | ORAL | Status: DC
  Administered 2022-12-23: 0.4 mg via ORAL
  Filled 2022-12-22: qty 1

## 2022-12-22 NOTE — Assessment & Plan Note (Addendum)
DM2-reduce home Lantus to 25 units twice daily, 8 units lispro 3 times daily.  4 times daily checks with moderate lispro sliding scale correction factor. Neuropathy-continue gabapentin GERD-continue PPI Varices-continue propranolol BPH-continue Flomax

## 2022-12-22 NOTE — ED Notes (Signed)
Gauze saturated and placed around stoma, non-adherent placed and pressure applied for bleeding control.

## 2022-12-22 NOTE — ED Triage Notes (Signed)
Pt states he is bleeding into his colostomy bag. Pt was in the hospital for same and discharged earlier today. Advised if it started to bleed again to come back to the ED.

## 2022-12-22 NOTE — H&P (Signed)
History and Physical    Patient: Alexander Cunningham [id number] DOB: 02-23-1951 DOA: 12/22/2022 DOS: the patient was seen and examined on 12/22/2022 PCP: Juluis Pitch, MD  Patient coming from: Home  Chief Complaint:  Chief Complaint  Patient presents with   Bleeding from colostomy bag   HPI: Alexander Cunningham is a 72 y.o. male with medical history significant for ulcerative colitis status post proctocolectomy and right lower quadrant ileostomy formation in 2016, type 2 diabetes, hypertension, BPH, who presents with bleeding from his ostomy site.  Patient was discharged earlier today after being admitted from March 19 to 23 for the same issue.  He reported during that admission that something similar it happened several years ago and required him to "have stitches placed".  During that admission there was concern for bleeding from multiple different etiologies but all of these were sequentially ruled out.  He underwent both an EGD and a colonoscopy.  EGD showed nodular mucosa in the duodenal bulb which was biopsied (pathology not yet back) and portal hypertensive gastropathy with small esophageal varices.  Colonoscopy was unremarkable.  CT scan during that admission did not show any abnormalities outside of cirrhosis.  General surgery and gastroenterology were both consulted.  Ultimately silver nitrate was applied to areas of concern around the ileostomy and no further bleeding was noted.  He also received 2 units packed RBCs.  On my interview in the ED patient reports that he was sitting at home watching TV when he felt that his ostomy bag was getting full.  He went to go empty it and found that it had completely filled with blood.  At that point he decided to return to the ED where and he had another full ostomy bag filled with blood.  He otherwise feels fine, and has no complaints.  In the ED vital signs were unremarkable.  CMP showed baseline CKD, mild hyperglycemia, overall no acute  findings or changes from prior.  CBC showed hemoglobin of 10.3, improved from 8.7 earlier today.  Platelets were 130, also improved from 100 earlier today.  Type and screen were obtained, dressing with topical TXA was applied in the ED, and he was admitted for further management.   Review of Systems: As mentioned in the history of present illness. All other systems reviewed and are negative. Past Medical History:  Diagnosis Date   CHF (congestive heart failure) (HCC)    Cirrhosis ([place])    Diabetes mellitus without complication ([place])    Esophageal varices (HCC)    GERD (gastroesophageal reflux disease)    Portal hypertension ([place])    Ulcerative colitis ([place])    Past Surgical History:  Procedure Laterality Date   ESOPHAGOGASTRODUODENOSCOPY N/A 08/17/2021   Procedure: ESOPHAGOGASTRODUODENOSCOPY (EGD);  Surgeon: Annamaria Helling, DO;  Location: [facility] ENDOSCOPY;  Service: Gastroenterology;  Laterality: N/A;  IDDM   ESOPHAGOGASTRODUODENOSCOPY N/A 08/30/2022   Procedure: ESOPHAGOGASTRODUODENOSCOPY (EGD);  Surgeon: Annamaria Helling, DO;  Location: [facility] ENDOSCOPY;  Service: Gastroenterology;  Laterality: N/A;   ESOPHAGOGASTRODUODENOSCOPY (EGD) WITH PROPOFOL N/A 12/20/2022   Procedure: ESOPHAGOGASTRODUODENOSCOPY (EGD) WITH PROPOFOL;  Surgeon: [name], MD;  Location: [facility] ENDOSCOPY;  Service: Gastroenterology;  Laterality: N/A;   HERNIA REPAIR     ILEOSCOPY N/A 12/20/2022   Procedure: ILEOSCOPY THROUGH STOMA;  Surgeon: [name], MD;  Location: [facility] ENDOSCOPY;  Service: Gastroenterology;  Laterality: N/A;   TOTAL COLECTOMY     with end ileostomy   Social History:  reports that he has quit smoking. His smoking use  included cigarettes. He has been exposed to tobacco smoke. He has never used smokeless tobacco. He reports current alcohol use. He reports that he does not use drugs.  Allergies  Allergen Reactions   Iodinated Contrast Media Rash and Anaphylaxis    body  feels on fire   Tricor [Fenofibrate] Rash   Other Rash    Pt states the bed sheets at the hospital gave him a bad purple rash on his back.   Oxycodone Hives    History reviewed. No pertinent family history.  Prior to Admission medications   Medication Sig Start Date End Date Taking? Authorizing Provider  ferrous sulfate 325 (65 FE) MG tablet Take 325 mg by mouth every morning. 01/04/21  Yes [provider]  gabapentin (NEURONTIN) 300 MG capsule Take 300 mg by mouth 3 (three) times daily. 05/16/21  Yes [provider]  insulin aspart (NOVOLOG) 100 UNIT/ML FlexPen Inject 15 Units into the skin in the morning, at noon, and at bedtime.   Yes [provider]  Insulin Glargine (BASAGLAR KWIKPEN) 100 UNIT/ML Inject 30 Units into the skin See admin instructions. Inject 50u under the skin every morning and inject 40u under the skin every night Patient taking differently: Inject 40-50 Units into the skin See admin instructions. Inject 50u under the skin every morning and inject 40u under the skin every night 10/28/22  Yes Sharen Hones, MD  magnesium oxide (MAG-OX) 400 MG tablet Take 1 tablet by mouth 2 (two) times daily. 05/03/21  Yes [provider]  omeprazole (PRILOSEC) 40 MG capsule Take 40 mg by mouth daily. 05/26/21  Yes [provider]  propranolol (INDERAL) 10 MG tablet Take 10 mg by mouth 2 (two) times daily. 05/14/21  Yes [provider]  tamsulosin (FLOMAX) 0.4 MG CAPS capsule Take 1 capsule (0.4 mg total) by mouth daily. 11/12/22  Yes Billey Co, MD  sodium bicarbonate 650 MG tablet Take 650 mg by mouth 4 (four) times daily. Patient not taking: Reported on 12/19/2022    [provider]    Physical Exam: Vitals:   12/22/22 1833 12/22/22 2017 12/22/22 2248  BP: (!) 133/59  (!) 142/74  Pulse: 69  74  Resp: 18  20  Temp: (!) 97.5 F (36.4 C)  97.7 F (36.5 C)  TempSrc: Oral  Oral  SpO2: 99%  100%  Weight:  80.3 kg   Height:   5\' 10"  (1.778 m) 5\' 10"  (1.778 m)   Physical Exam Constitutional:      General: He is not in acute distress.    Appearance: Normal appearance. He is not ill-appearing, toxic-appearing or diaphoretic.  HENT:     Head: Normocephalic and atraumatic.     Mouth/Throat:     Mouth: Mucous membranes are moist.  Eyes:     General: No scleral icterus. Cardiovascular:     Rate and Rhythm: Normal rate and regular rhythm.     Heart sounds: Normal heart sounds.  Pulmonary:     Effort: Pulmonary effort is normal.     Breath sounds: Normal breath sounds.  Abdominal:     General: Abdomen is flat. Bowel sounds are normal. There is no distension.     Palpations: Abdomen is soft.     Comments: Ostomy present in right lower quadrant.  Some stool coming out of ostomy.  No active bleeding appreciated.  Musculoskeletal:     Right lower leg: No edema.     Left lower leg: No edema.  Skin:  General: Skin is warm and dry.  Neurological:     General: No focal deficit present.     Mental Status: He is alert and oriented to person, place, and time.  Psychiatric:        Mood and Affect: Mood normal.        Behavior: Behavior normal.      Data Reviewed: Results for orders placed or performed during the hospital encounter of 12/22/22 (from the past 24 hour(s))  Type and screen [place]     Status: None (Preliminary result)   Collection Time: 12/22/22  6:37 PM  Result Value Ref Range   ABO/RH(D) PENDING    Antibody Screen PENDING    Sample Expiration      12/25/2022,2359 Performed at [facility], [address] [place], [place] [address]   CBC with Differential     Status: Abnormal   Collection Time: 12/22/22  8:17 PM  Result Value Ref Range   WBC 7.3 4.0 - 10.5 K/uL   RBC 3.29 (L) 4.22 - 5.81 MIL/uL   Hemoglobin 10.3 (L) 13.0 - 17.0 g/dL   HCT 32.4 (L) 39.0 - 52.0 %   MCV 98.5 80.0 - 100.0 fL   MCH 31.3 26.0 - 34.0 pg   MCHC 31.8 30.0 - 36.0 g/dL   RDW  14.9 11.5 - 15.5 %   Platelets 130 (L) 150 - 400 K/uL   nRBC 0.0 0.0 - 0.2 %   Neutrophils Relative % 60 %   Neutro Abs 4.3 1.7 - 7.7 K/uL   Lymphocytes Relative 24 %   Lymphs Abs 1.8 0.7 - 4.0 K/uL   Monocytes Relative 7 %   Monocytes Absolute 0.5 0.1 - 1.0 K/uL   Eosinophils Relative 7 %   Eosinophils Absolute 0.5 0.0 - 0.5 K/uL   Basophils Relative 1 %   Basophils Absolute 0.1 0.0 - 0.1 K/uL   Immature Granulocytes 1 %   Abs Immature Granulocytes 0.04 0.00 - 0.07 K/uL  Comprehensive metabolic panel     Status: Abnormal   Collection Time: 12/22/22  8:17 PM  Result Value Ref Range   Sodium 139 135 - 145 mmol/L   Potassium 4.2 3.5 - 5.1 mmol/L   Chloride 116 (H) 98 - 111 mmol/L   CO2 17 (L) 22 - 32 mmol/L   Glucose, Bld 158 (H) 70 - 99 mg/dL   BUN 19 8 - 23 mg/dL   Creatinine, Ser 1.37 (H) 0.61 - 1.24 mg/dL   Calcium 8.6 (L) 8.9 - 10.3 mg/dL   Total Protein 6.6 6.5 - 8.1 g/dL   Albumin 3.4 (L) 3.5 - 5.0 g/dL   AST 44 (H) 15 - 41 U/L   ALT 26 0 - 44 U/L   Alkaline Phosphatase 79 38 - 126 U/L   Total Bilirubin 0.7 0.3 - 1.2 mg/dL   GFR, Estimated 55 (L) >60 mL/min   Anion gap 6 5 - 15  Glucose, capillary     Status: Abnormal   Collection Time: 12/22/22 10:50 PM  Result Value Ref Range   Glucose-Capillary 149 (H) 70 - 99 mg/dL   CT ABDOMEN PELVIS WO CONTRAST CLINICAL DATA:  Abdominal pain.  Lower GI bleed.  EXAM: CT ABDOMEN AND PELVIS WITHOUT CONTRAST  TECHNIQUE: Multidetector CT imaging of the abdomen and pelvis was performed following the standard protocol without IV contrast.  RADIATION DOSE REDUCTION: This exam was performed according to the departmental dose-optimization program which includes automated exposure control, adjustment  of the mA and/or kV according to patient size and/or use of iterative reconstruction technique.  COMPARISON:  CT abdomen pelvis 03/05/2022.  FINDINGS: Evaluation of this exam is limited in the absence of  intravenous contrast.  Lower chest: Diffuse chronic interstitial coarsening of the lung bases. The visualized lung bases are otherwise clear.  No intra-abdominal free air or free fluid.  Hepatobiliary: There is irregular appearance of the liver contour in keeping with cirrhosis. The gallbladder is not visualized, likely surgically absent.  Pancreas: Unremarkable. No pancreatic ductal dilatation or surrounding inflammatory changes.  Spleen: Normal in size without focal abnormality.  Adrenals/Urinary Tract: The adrenal glands are unremarkable. There is no hydronephrosis or nephrolithiasis on either side. The visualized ureters appear unremarkable. The urinary bladder is collapsed.  Stomach/Bowel: Postsurgical changes of total colectomy with a right lower quadrant ileostomy. There is no bowel obstruction or active inflammation.  Vascular/Lymphatic: Moderate aortoiliac atherosclerotic disease. The IVC is unremarkable. No portal venous gas. There is no adenopathy.  Reproductive: The prostate gland is enlarged measuring 5.3 cm in transverse axial diameter. The seminal vesicles are symmetric.  Other: None  Musculoskeletal: Degenerative changes of the spine. No acute osseous pathology.  IMPRESSION: 1. No acute intra-abdominal or pelvic pathology. 2. Cirrhosis. 3. Postsurgical changes of total colectomy with a right lower quadrant ileostomy. No bowel obstruction. 4.  Aortic Atherosclerosis (ICD10-I70.0).  Electronically Signed   By: Anner Crete M.D.   On: 12/18/2022 22:49    Assessment and Plan: Alexander Cunningham is a 72 y.o. male with medical history significant for ulcerative colitis status post proctocolectomy and right lower quadrant ileostomy formation in 2016, type 2 diabetes, hypertension, BPH, who presents with bleeding from his ostomy site.   * Bleeding from ileostomy stoma [facility]) Admitted from March 19 to 23 for the same, underwent extensive workup with  ultimately only having silver nitrate and pressure dressing applied as only significant treatment though of note did require 2 units packed RBCs due to dwindling hemoglobin.  At present hemoglobin is stable and there is no bleeding.  Plan for general surgery evaluation in a.m.  Known medical problems DM2-reduce home Lantus to 25 units twice daily, 8 units lispro 3 times daily.  4 times daily checks with moderate lispro sliding scale correction factor. Neuropathy-continue gabapentin GERD-continue PPI Varices-continue propranolol BPH-continue Flomax      Advance Care Planning:   Code Status: Full Code , confirmed with patient  Consults: Gen Surg in AM  Family Communication: none  Severity of Illness: The appropriate patient status for this patient is OBSERVATION. Observation status is judged to be reasonable and necessary in order to provide the required intensity of service to ensure the patient's safety. The patient's presenting symptoms, physical exam findings, and initial radiographic and laboratory data in the context of their medical condition is felt to place them at decreased risk for further clinical deterioration. Furthermore, it is anticipated that the patient will be medically stable for discharge from the hospital within 2 midnights of admission.   Author: Clarnce Flock, MD 12/22/2022 10:58 PM  For on call review [email or web address].

## 2022-12-22 NOTE — TOC Transition Note (Signed)
Transition of Care [facility]) - CM/SW Discharge Note   Patient Details  Name: Alexander Cunningham MRN: [id number] Date of Birth: 1951-09-22  Transition of Care [facility]) CM/SW Contact:  Izola Price, RN Phone Number: 12/22/2022, 10:58 AM   Clinical Narrative:  3/23: [place] Pam in to [facility] ED after being seen at [facility] ED on 3/18 for bleeding around colectomy/ileostomy stoma site. Was treated at [facility] with silver nitrate per ED notes. Presented to [facility] ED on 12/18/22 for continued bleeding around stoma. Blood counts trending down from what was checked at [facility]. Received one unit of blood this admission. CT abdomen/pelvis completed. Silver Nitrate to stoma. Blood counts stabilized for discharge today. Able to ambulate independently. Not TOC needs identified. Spouse to transport to home on discharge. Simmie Davies RN CM     Final next level of care: Home/Self Care Barriers to Discharge: Barriers Resolved   Patient Goals and CMS Choice      Discharge Placement                         Discharge Plan and Services Additional resources added to the After Visit Summary for                  DME Arranged: N/A DME Agency: NA       HH Arranged: NA HH Agency: NA        Social Determinants of Health (SDOH) Interventions SDOH Screenings   Food Insecurity: No Food Insecurity (12/19/2022)  Housing: Low Risk  (12/19/2022)  Transportation Needs: No Transportation Needs (12/19/2022)  Utilities: Not At Risk (12/19/2022)  Depression (PHQ2-9): Low Risk  (08/29/2022)  Tobacco Use: Medium Risk (12/21/2022)     Readmission Risk Interventions     No data to display

## 2022-12-22 NOTE — ED Provider Notes (Signed)
[facility] Provider Note    Event Date/Time   First MD Initiated Contact with Patient 12/22/22 1905     (approximate)   History   Bleeding from colostomy bag   HPI  Alexander Cunningham is a 72 y.o. male who presents to the emergency department today because of concerns for bleeding into his colostomy bag.  The patient was discharged from the hospital today after an admission for bleeding from his colostomy bag resulting in anemia requiring blood transfusions.  The patient says that he had 2 episodes of bloody output in his colostomy bag.  Denies any lightheadedness or shortness of breath although stated he did not do much today after coming home from the hospital.     Physical Exam   Triage Vital Signs: ED Triage Vitals  Enc Vitals Group     BP 12/22/22 1833 (!) 133/59     Pulse Rate 12/22/22 1833 69     Resp 12/22/22 1833 18     Temp 12/22/22 1833 (!) 97.5 F (36.4 C)     Temp Source 12/22/22 1833 Oral     SpO2 12/22/22 1833 99 %     Weight --      Height --      Head Circumference --      Peak Flow --      Pain Score 12/22/22 1834 5     Pain Loc --      Pain Edu? --      Excl. in [place]? --     Most recent vital signs: Vitals:   12/22/22 1833  BP: (!) 133/59  Pulse: 69  Resp: 18  Temp: (!) 97.5 F (36.4 C)  SpO2: 99%   General: Awake, alert, oriented. CV:  Good peripheral perfusion. Regular rate and rhythm. Resp:  Normal effort. Lungs clear. Abd:  No distention. Stoma in place, no active bleeding.   ED Results / Procedures / Treatments   Labs (all labs ordered are listed, but only abnormal results are displayed) Labs Reviewed  CBC WITH DIFFERENTIAL/PLATELET - Abnormal; Notable for the following components:      Result Value   RBC 3.29 (*)    Hemoglobin 10.3 (*)    HCT 32.4 (*)    Platelets 130 (*)    All other components within normal limits  COMPREHENSIVE METABOLIC PANEL - Abnormal; Notable for the following components:    Chloride 116 (*)    CO2 17 (*)    Glucose, Bld 158 (*)    Creatinine, Ser 1.37 (*)    Calcium 8.6 (*)    Albumin 3.4 (*)    AST 44 (*)    GFR, Estimated 55 (*)    All other components within normal limits  TYPE AND SCREEN  TYPE AND SCREEN     EKG  None   RADIOLOGY None   PROCEDURES:  Critical Care performed: No    MEDICATIONS ORDERED IN ED: Medications - No data to display   IMPRESSION / MDM / [place] / ED COURSE  I reviewed the triage vital signs and the nursing notes.                              Differential diagnosis includes, but is not limited to, anemia, bleed  Patient's presentation is most consistent with acute presentation with potential threat to life or bodily function.   The patient is on the cardiac monitor to  evaluate for evidence of arrhythmia and/or significant heart rate changes.  Patient presented to the emergency department today because of concerns for bleeding from stoma.  Patient was discharged from the hospital earlier today with the same symptoms. On initial exam no bleeding from the stoma. Blood work did not show any concerning anemia. However after my initial exam the patient did start bleeding again. I did witness a large amount of blood in the toilet but the bleeding had stopped again on my reexam. Given further bleeding I do think patient would benefit from readmission. Discussed with Dr. Dione Plover with the hospitalist service.       FINAL CLINICAL IMPRESSION(S) / ED DIAGNOSES   Final diagnoses:  Bleeding from colostomy stoma [facility])    Note:  This document was prepared using Dragon voice recognition software and may include unintentional dictation errors.    Nance Pear, MD 12/22/22 [phone]

## 2022-12-22 NOTE — Discharge Summary (Signed)
Physician Discharge Summary  Alexander Cunningham [id number] DOB: December 20, 1950 DOA: 12/18/2022  PCP: Juluis Pitch, MD  Admit date: 12/18/2022 Discharge date: 12/22/2022  Admitted From: home  Disposition:  home   Recommendations for Outpatient Follow-up:  Follow up with PCP w//in 1 week Need to get CBC w/in 1 week  Home Health: no  Equipment/Devices:  Discharge Condition: stable  CODE STATUS: full  Diet recommendation: Heart Healthy / Carb Modified   Brief/Interim Summary: HPI was taken from Dr. Damita Dunnings:  Tawana Scale is a 72 y.o. male with medical history significant for diabetes type 2, hypertension, ulcerative colitis s/pcolectomy/ ileostomy right lower quadrant  since 2016 who presents to the ED with recurrent bleeding around ostomy site, now associated with lightheadedness.  Patient was seen in the ED at [facility] the day prior on 3/18 when he presented after noticing blood squirting from around his ostomy site after taking a shower.  He reported a similar episode about 4 years prior that quiet stitches for hemostasis.  The bleeding stopped with pressure and he was discharged.  His hemoglobin at [facility] was around his baseline at 10.6.  The episode recurred today and he continued to ooze and called EMS.  On arrival of EMS systolic blood pressure was in the 50s.  He denied chest pain or shortness of breath. ED course and data Review: BP on arrival 128/59 with pulse 66 and otherwise normal vitals.  Hemoglobin 8.3, down from 10.6 the day prior.  Platelets of 130, up from 94 a day ago. Other notable labs include glucose of 290, creatinine at baseline of 1.61 but with bicarb of 17, CT abdomen and pelvis showed no acute intra-abdominal or pelvic pathology and showed postsurgical changes of total colectomy with right lower quadrant ileostomy without obstruction. The ED provider: Spoke with surgeon, Dr. Hampton Abbot who recommended applying silver nitrate around the site.  This was done from the ED.   Hospitalist consulted for admission.  As per Dr. Jimmye Norman 3/20-3/23/24: Pt presented w/ acute blood loss anemia possibly from ileostomy stoma vs esophageal varices. Compressive dressing & silver nitrate was used. Pt also received 2 units of pRBCs while inpatient. Hb was 8.7 on the day of d/c. No bleeding was present on the day of d/c. Pt will need to get a CBC w/in 1 week as outpatient. Pt verbalized his understanding. For more information, please see previous progress/consult notes.   Discharge Diagnoses:  Principal Problem:   ABLA (acute blood loss anemia) Active Problems:   Bleeding from ileostomy stoma (HCC)   Thrombocytopenia ([place])   Uncontrolled type 2 diabetes mellitus with hyperglycemia, with long-term current use of insulin (HCC)   Metabolic acidosis   Stage 3a chronic kidney disease (HCC)   Essential hypertension   Blood loss anemia   Ileostomy status (HCC)  Acute blood loss anemia: etiology unclear, possibly recurrent bleeding from ileostomy stoma vs esophageal varices as pt hx of cirrhosis. Hb 6.5 today so 1 unit of pRBCs given. Hx of colectomy/ileostomy 2016 secondary to hx of ulcerative colitis. Hx of bleeding from stomal site 3 years prior requiring sutures & again on 3/18 improving w/ compressive dressing.  Silver nitrate to stoma prn as per gen surg. S/p 2 unit of pRBCs transfused so far. S/p EGD which showed nodular mucosa in the duodenal bulb which was biopsied & portal hypertensive gastropathy, small esophageal varices. Ileoscopy showed normal neo-terminal ileum & stoma appeared healthy except for area which was previously treated w/ silver nitrate. CT abd/pelvis shows no acute intra  abd/pelvic abnormality but cirrhosis. 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 Continue on SSI  w/ accuchecks   HTN: continue on home dose of propranolol   CKDIIIa: Cr is at baseline. Will continue to monitor   Discharge Instructions  Discharge Instructions     Diet - low sodium heart healthy   Complete by: As directed    Diet Carb Modified   Complete by: As directed    Discharge instructions   Complete by: As directed    F/u w/ PCP within 1 week. Need a CBC within 1 week to check Hb and Hct   Increase activity slowly   Complete by: As directed       Allergies as of 12/22/2022       Reactions   Iodinated Contrast Media Rash, Anaphylaxis   body feels on fire   Tricor [fenofibrate] Rash   Other Rash   Pt states the bed sheets at the hospital gave him a bad purple rash on his back.   Oxycodone Hives        Medication List     STOP taking these medications    lisinopril 5 MG tablet Commonly known as: ZESTRIL       TAKE these medications    Basaglar KwikPen 100 UNIT/ML Inject 30 Units into the skin See admin instructions. Inject 50u under the skin every morning and inject 40u under the skin every night What changed: how much to take   ferrous sulfate 325 (65 FE) MG tablet Take 325 mg by mouth every morning.   gabapentin 300 MG capsule Commonly known as: NEURONTIN Take 300 mg by mouth 3 (three) times daily.   insulin aspart 100 UNIT/ML FlexPen Commonly known as: NOVOLOG Inject 15 Units into the skin in the morning, at noon, and at bedtime.   magnesium oxide 400 MG tablet Commonly known as: MAG-OX Take 1 tablet by mouth 2 (two) times daily.   omeprazole 40 MG capsule Commonly known as: PRILOSEC Take 40 mg by mouth daily.   propranolol 10 MG tablet Commonly known as: INDERAL Take 10 mg by mouth 2 (two) times daily.   sodium bicarbonate 650 MG tablet Take 650 mg by mouth 4 (four) times daily.   tamsulosin 0.4 MG Caps capsule Commonly known as: FLOMAX Take 1 capsule (0.4 mg total) by mouth daily.        Allergies  Allergen Reactions    Iodinated Contrast Media Rash and Anaphylaxis    body feels on fire   Tricor [Fenofibrate] Rash   Other Rash    Pt states the bed sheets at the hospital gave him a bad purple rash on his back.   Oxycodone Hives    Consultations: GI General surg    Procedures/Studies: CT ABDOMEN PELVIS WO CONTRAST  Result Date: 12/18/2022 CLINICAL DATA:  Abdominal pain.  Lower GI bleed. EXAM: CT ABDOMEN AND PELVIS WITHOUT CONTRAST TECHNIQUE: Multidetector CT imaging of the abdomen and pelvis was performed following the standard protocol without IV contrast. RADIATION DOSE REDUCTION: This exam was performed according to the departmental dose-optimization program which includes automated exposure control, adjustment of the mA and/or kV according to patient size and/or use of iterative reconstruction technique. COMPARISON:  CT abdomen pelvis 03/05/2022. FINDINGS: Evaluation of this exam is limited in the  absence of intravenous contrast. Lower chest: Diffuse chronic interstitial coarsening of the lung bases. The visualized lung bases are otherwise clear. No intra-abdominal free air or free fluid. Hepatobiliary: There is irregular appearance of the liver contour in keeping with cirrhosis. The gallbladder is not visualized, likely surgically absent. Pancreas: Unremarkable. No pancreatic ductal dilatation or surrounding inflammatory changes. Spleen: Normal in size without focal abnormality. Adrenals/Urinary Tract: The adrenal glands are unremarkable. There is no hydronephrosis or nephrolithiasis on either side. The visualized ureters appear unremarkable. The urinary bladder is collapsed. Stomach/Bowel: Postsurgical changes of total colectomy with a right lower quadrant ileostomy. There is no bowel obstruction or active inflammation. Vascular/Lymphatic: Moderate aortoiliac atherosclerotic disease. The IVC is unremarkable. No portal venous gas. There is no adenopathy. Reproductive: The prostate gland is enlarged measuring 5.3  cm in transverse axial diameter. The seminal vesicles are symmetric. Other: None Musculoskeletal: Degenerative changes of the spine. No acute osseous pathology. IMPRESSION: 1. No acute intra-abdominal or pelvic pathology. 2. Cirrhosis. 3. Postsurgical changes of total colectomy with a right lower quadrant ileostomy. No bowel obstruction. 4.  Aortic Atherosclerosis (ICD10-I70.0). Electronically Signed   By: Anner Crete M.D.   On: 12/18/2022 22:49   (Echo, Carotid, EGD, Colonoscopy, ERCP)    Subjective: Pt denies any complaints. Pt denies any bleeding    Discharge Exam: Vitals:   12/22/22 0329 12/22/22 0759  BP: (!) 140/63 (!) 132/58  Pulse: 75 71  Resp: 18 16  Temp: 98.4 F (36.9 C) 98.4 F (36.9 C)  SpO2: 100% 99%   Vitals:   12/22/22 0100 12/22/22 0200 12/22/22 0329 12/22/22 0759  BP:   (!) 140/63 (!) 132/58  Pulse:   75 71  Resp: (!) 21 20 18 16   Temp:   98.4 F (36.9 C) 98.4 F (36.9 C)  TempSrc:    Oral  SpO2:   100% 99%  Weight:      Height:        General: Pt is alert, awake, not in acute distress Cardiovascular: S1/S2 +, no rubs, no gallops Respiratory: CTA bilaterally, no wheezing, no rhonchi Abdominal: Soft, NT, ND, bowel sounds + Extremities: no cyanosis    The results of significant diagnostics from this hospitalization (including imaging, microbiology, ancillary and laboratory) are listed below for reference.     Microbiology: No results found for this or any previous visit (from the past 240 hour(s)).   Labs: BNP (last 3 results) No results for input(s): "BNP" in the last 8760 hours. Basic Metabolic Panel: Recent Labs  Lab 12/18/22 2220 12/19/22 0555 12/20/22 0731 12/21/22 0613 12/22/22 0413  NA 137 140 142 145 140  K 5.0 4.4 4.1 4.0 3.9  CL 117* 122* 122* 118* 117*  CO2 17* 15* 16* 18* 18*  GLUCOSE 290* 137* 155* 148* 166*  BUN 28* 31* 26* 22 19  CREATININE 1.61* 1.49* 1.39* 1.35* 1.30*  CALCIUM 8.2* 8.2* 8.2* 8.3* 8.4*   Liver  Function Tests: Recent Labs  Lab 12/18/22 2220  AST 29  ALT 18  ALKPHOS 80  BILITOT 0.6  PROT 5.0*  ALBUMIN 2.6*   No results for input(s): "LIPASE", "AMYLASE" in the last 168 hours. No results for input(s): "AMMONIA" in the last 168 hours. CBC: Recent Labs  Lab 12/18/22 2220 12/19/22 0555 12/19/22 1530 12/20/22 0731 12/21/22 0613 12/21/22 1520 12/22/22 0413  WBC 6.8  --   --  3.7* 3.2*  --  5.3  NEUTROABS 3.4  --   --   --   --   --   --  HGB 8.3*   < > 7.5* 7.3* 6.5* 8.0* 8.7*  HCT 25.5*  --   --  21.9* 19.5* 24.4* 26.2*  MCV 97.3  --   --  92.0 92.4  --  92.3  PLT 130*  --   --  72* 62*  --  100*   < > = values in this interval not displayed.   Cardiac Enzymes: No results for input(s): "CKTOTAL", "CKMB", "CKMBINDEX", "TROPONINI" in the last 168 hours. BNP: Invalid input(s): "POCBNP" CBG: Recent Labs  Lab 12/21/22 0830 12/21/22 1222 12/21/22 1612 12/21/22 2037 12/22/22 0829  GLUCAP 154* 187* 253* 225* 177*   D-Dimer No results for input(s): "DDIMER" in the last 72 hours. Hgb A1c No results for input(s): "HGBA1C" in the last 72 hours. Lipid Profile No results for input(s): "CHOL", "HDL", "LDLCALC", "TRIG", "CHOLHDL", "LDLDIRECT" in the last 72 hours. Thyroid function studies No results for input(s): "TSH", "T4TOTAL", "T3FREE", "THYROIDAB" in the last 72 hours.  Invalid input(s): "FREET3" Anemia work up Recent Labs    12/20/22 0731  FOLATE 16.4  FERRITIN 101   Urinalysis    Component Value Date/Time   COLORURINE YELLOW (A) 10/25/2022 2354   APPEARANCEUR HAZY (A) 10/25/2022 2354   LABSPEC 1.015 10/25/2022 2354   PHURINE 5.0 10/25/2022 2354   GLUCOSEU 50 (A) 10/25/2022 2354   HGBUR MODERATE (A) 10/25/2022 2354   BILIRUBINUR NEGATIVE 10/25/2022 2354   KETONESUR NEGATIVE 10/25/2022 2354   PROTEINUR 30 (A) 10/25/2022 2354   NITRITE NEGATIVE 10/25/2022 2354   LEUKOCYTESUR NEGATIVE 10/25/2022 2354   Sepsis Labs Recent Labs  Lab 12/18/22 2220  12/20/22 0731 12/21/22 0613 12/22/22 0413  WBC 6.8 3.7* 3.2* 5.3   Microbiology No results found for this or any previous visit (from the past 240 hour(s)).   Time coordinating discharge: Over 30 minutes  SIGNED:   Wyvonnia Dusky, MD  [facility] 12/22/2022, 10:46 AM Pager   If 7PM-7AM, please contact night-coverage [email or web address]

## 2022-12-22 NOTE — Assessment & Plan Note (Signed)
Admitted from March 19 to 23 for the same, underwent extensive workup with ultimately only having silver nitrate and pressure dressing applied as only significant treatment though of note did require 2 units packed RBCs due to dwindling hemoglobin.  At present hemoglobin is stable and there is no bleeding.  Plan for general surgery evaluation in a.m.

## 2022-12-23 DIAGNOSIS — I10 Essential (primary) hypertension: Secondary | ICD-10-CM

## 2022-12-23 DIAGNOSIS — K9401 Colostomy hemorrhage: Secondary | ICD-10-CM | POA: Diagnosis not present

## 2022-12-23 DIAGNOSIS — N1831 Chronic kidney disease, stage 3a: Secondary | ICD-10-CM | POA: Diagnosis not present

## 2022-12-23 LAB — CBC
HCT: 22.9 % — ABNORMAL LOW (ref 39.0–52.0)
Hemoglobin: 7.6 g/dL — ABNORMAL LOW (ref 13.0–17.0)
MCH: 31.1 pg (ref 26.0–34.0)
MCHC: 33.2 g/dL (ref 30.0–36.0)
MCV: 93.9 fL (ref 80.0–100.0)
Platelets: UNDETERMINED 10*3/uL (ref 150–400)
RBC: 2.44 MIL/uL — ABNORMAL LOW (ref 4.22–5.81)
RDW: 15.2 % (ref 11.5–15.5)
WBC: 6 10*3/uL (ref 4.0–10.5)
nRBC: 0 % (ref 0.0–0.2)

## 2022-12-23 LAB — COMPREHENSIVE METABOLIC PANEL
ALT: 19 U/L (ref 0–44)
AST: 37 U/L (ref 15–41)
Albumin: 2.8 g/dL — ABNORMAL LOW (ref 3.5–5.0)
Alkaline Phosphatase: 50 U/L (ref 38–126)
Anion gap: 8 (ref 5–15)
BUN: 20 mg/dL (ref 8–23)
CO2: 18 mmol/L — ABNORMAL LOW (ref 22–32)
Calcium: 8.6 mg/dL — ABNORMAL LOW (ref 8.9–10.3)
Chloride: 116 mmol/L — ABNORMAL HIGH (ref 98–111)
Creatinine, Ser: 1.43 mg/dL — ABNORMAL HIGH (ref 0.61–1.24)
GFR, Estimated: 52 mL/min — ABNORMAL LOW (ref >60)
Glucose, Bld: 154 mg/dL — ABNORMAL HIGH (ref 70–99)
Potassium: 4.2 mmol/L (ref 3.5–5.1)
Sodium: 142 mmol/L (ref 135–145)
Total Bilirubin: 0.8 mg/dL (ref 0.3–1.2)
Total Protein: 5.5 g/dL — ABNORMAL LOW (ref 6.5–8.1)

## 2022-12-23 LAB — TYPE AND SCREEN
ABO/RH(D): O POS
Antibody Screen: NEGATIVE

## 2022-12-23 LAB — BLOOD GAS, VENOUS
Acid-base deficit: 7.1 mmol/L — ABNORMAL HIGH (ref 0.0–2.0)
Bicarbonate: 18.7 mmol/L — ABNORMAL LOW (ref 20.0–28.0)
O2 Saturation: 62.5 %
Patient temperature: 37
pCO2, Ven: 38 mmHg — ABNORMAL LOW (ref 44–60)
pH, Ven: 7.3 (ref 7.25–7.43)
pO2, Ven: 38 mmHg (ref 32–45)

## 2022-12-23 LAB — PREPARE RBC (CROSSMATCH)

## 2022-12-23 LAB — HEMOGLOBIN AND HEMATOCRIT, BLOOD
HCT: 25.7 % — ABNORMAL LOW (ref 39.0–52.0)
HCT: 26.3 % — ABNORMAL LOW (ref 39.0–52.0)
Hemoglobin: 8.6 g/dL — ABNORMAL LOW (ref 13.0–17.0)
Hemoglobin: 8.7 g/dL — ABNORMAL LOW (ref 13.0–17.0)

## 2022-12-23 LAB — PROTIME-INR
INR: 1.4 — ABNORMAL HIGH (ref 0.8–1.2)
Prothrombin Time: 16.9 seconds — ABNORMAL HIGH (ref 11.4–15.2)

## 2022-12-23 LAB — GLUCOSE, CAPILLARY: Glucose-Capillary: 145 mg/dL — ABNORMAL HIGH (ref 70–99)

## 2022-12-23 LAB — APTT: aPTT: 26 seconds (ref 24–36)

## 2022-12-23 MED ORDER — SODIUM CHLORIDE 0.9% IV SOLUTION: Freq: Once | INTRAVENOUS | Status: DC

## 2022-12-23 NOTE — Discharge Summary (Signed)
Physician Discharge Summary   Patient: Alexander Cunningham MRN: [id number] DOB: 03-25-1951  Admit date:     12/22/2022  Discharge date: 12/23/22  Discharge Physician: Lorella Nimrod   PCP: Juluis Pitch, MD   Recommendations at discharge:   Please obtain CBC and BMP in 1 week. Follow-up with primary care provider Follow-up with general surgery  Discharge Diagnoses: Principal Problem:   Bleeding from ileostomy stoma [facility]) Active Problems:   Thrombocytopenia ([place])   Uncontrolled type 2 diabetes mellitus with hyperglycemia, with long-term current use of insulin (HCC)   Stage 3a chronic kidney disease ([place])   History of colitis   Essential hypertension   Ulcerative colitis ([place])   Known medical problems   Hospital Course: Taken from H&P.  Alexander Cunningham is a 72 y.o. male with medical history significant for ulcerative colitis status post proctocolectomy and right lower quadrant ileostomy formation in 2016, type 2 diabetes, hypertension, BPH, who presents with bleeding from his ostomy site.   Patient was discharged earlier today after being admitted from March 19 to 23 for the same issue.  He reported during that admission that something similar it happened several years ago and required him to "have stitches placed".  During that admission there was concern for bleeding from multiple different etiologies but all of these were sequentially ruled out.  He underwent both an EGD and a colonoscopy.  EGD showed nodular mucosa in the duodenal bulb which was biopsied (pathology not yet back) and portal hypertensive gastropathy with small esophageal varices.  Colonoscopy was unremarkable.  CT scan during that admission did not show any abnormalities outside of cirrhosis.  General surgery and gastroenterology were both consulted.  Ultimately silver nitrate was applied to areas of concern around the ileostomy and no further bleeding was noted.  He also received 2 units packed RBCs.  At home  patient certainly noticed a ostomy bag was getting full, when he went to empty it, found to be full of blood so he decided to return to ED.  In the ED vital signs were unremarkable.  CMP showed baseline CKD, mild hyperglycemia, overall no acute findings or changes from prior.  CBC showed hemoglobin of 10.3, improved from 8.7 earlier today.  Platelets were 130, also improved from 100 earlier today.  Type and screen were obtained, dressing with topical TXA was applied in the ED, and he was admitted for further management.   3/24: Hemodynamically stable.  Hemoglobin decreased to 7.6.  No more bleeding since this morning.  General surgery evaluated him and cleared him for discharge, there is nothing more to offer except coming back to ED for recurrence of bleeding.  No emergent need for any surgical procedure.  Ordered 1 unit of PRBC.  Patient is being discharged after getting 1 unit.  No more active bleeding. Patient was instructed to come back if he experience more bleeding. No treatable reason found despite extensive evaluation by GI and general surgery.  Patient will be high risk for readmission.  He will continue on current medications and need to have a close follow-up with his providers for further recommendations.  Assessment and Plan: * Bleeding from ileostomy stoma [facility]) Admitted from March 19 to 23 for the same, underwent extensive workup with ultimately only having silver nitrate and pressure dressing applied as only significant treatment though of note did require 2 units packed RBCs due to dwindling hemoglobin.  At present hemoglobin is stable and there is no bleeding.  Plan for general surgery evaluation in a.m.  Known medical problems DM2-reduce home Lantus to 25 units twice daily, 8 units lispro 3 times daily.  4 times daily checks with moderate lispro sliding scale correction factor. Neuropathy-continue gabapentin GERD-continue PPI Varices-continue propranolol BPH-continue  Flomax   Consultants: General surgery Procedures performed: None Disposition: Home Diet recommendation:  Discharge Diet Orders (From admission, onward)     Start     Ordered   12/23/22 0000  Diet - low sodium heart healthy        12/23/22 1503           Cardiac diet DISCHARGE MEDICATION: Allergies as of 12/23/2022       Reactions   Iodinated Contrast Media Rash, Anaphylaxis   body feels on fire   Tricor [fenofibrate] Rash   Other Rash   Pt states the bed sheets at the hospital gave him a bad purple rash on his back.   Oxycodone Hives        Medication List     STOP taking these medications    sodium bicarbonate 650 MG tablet       TAKE these medications    Basaglar KwikPen 100 UNIT/ML Inject 30 Units into the skin See admin instructions. Inject 50u under the skin every morning and inject 40u under the skin every night What changed: how much to take   ferrous sulfate 325 (65 FE) MG tablet Take 325 mg by mouth every morning.   gabapentin 300 MG capsule Commonly known as: NEURONTIN Take 300 mg by mouth 3 (three) times daily.   insulin aspart 100 UNIT/ML FlexPen Commonly known as: NOVOLOG Inject 15 Units into the skin in the morning, at noon, and at bedtime.   magnesium oxide 400 MG tablet Commonly known as: MAG-OX Take 1 tablet by mouth 2 (two) times daily.   omeprazole 40 MG capsule Commonly known as: PRILOSEC Take 40 mg by mouth daily.   propranolol 10 MG tablet Commonly known as: INDERAL Take 10 mg by mouth 2 (two) times daily.   tamsulosin 0.4 MG Caps capsule Commonly known as: FLOMAX Take 1 capsule (0.4 mg total) by mouth daily.        Follow-up Information     Juluis Pitch, MD. Schedule an appointment as soon as possible for a visit in 1 week(s).   Specialty: Family Medicine Contact information: [address] [place] [address] [phone]                Discharge Exam: Danley Danker Weights   12/22/22 2017  Weight:  80.3 kg   General.  Frail elderly man, in no acute distress. Pulmonary.  Lungs clear bilaterally, normal respiratory effort. CV.  Regular rate and rhythm, no JVD, rub or murmur. Abdomen.  Soft, nontender, nondistended, BS positive.  Ostomy bag in place. CNS.  Alert and oriented .  No focal neurologic deficit. Extremities.  No edema, no cyanosis, pulses intact and symmetrical. Psychiatry.  Judgment and insight appears normal.   Condition at discharge: stable  The results of significant diagnostics from this hospitalization (including imaging, microbiology, ancillary and laboratory) are listed below for reference.   Imaging Studies: CT ABDOMEN PELVIS WO CONTRAST  Result Date: 12/18/2022 CLINICAL DATA:  Abdominal pain.  Lower GI bleed. EXAM: CT ABDOMEN AND PELVIS WITHOUT CONTRAST TECHNIQUE: Multidetector CT imaging of the abdomen and pelvis was performed following the standard protocol without IV contrast. RADIATION DOSE REDUCTION: This exam was performed according to the departmental dose-optimization program which includes automated exposure control, adjustment of the mA  and/or kV according to patient size and/or use of iterative reconstruction technique. COMPARISON:  CT abdomen pelvis 03/05/2022. FINDINGS: Evaluation of this exam is limited in the absence of intravenous contrast. Lower chest: Diffuse chronic interstitial coarsening of the lung bases. The visualized lung bases are otherwise clear. No intra-abdominal free air or free fluid. Hepatobiliary: There is irregular appearance of the liver contour in keeping with cirrhosis. The gallbladder is not visualized, likely surgically absent. Pancreas: Unremarkable. No pancreatic ductal dilatation or surrounding inflammatory changes. Spleen: Normal in size without focal abnormality. Adrenals/Urinary Tract: The adrenal glands are unremarkable. There is no hydronephrosis or nephrolithiasis on either side. The visualized ureters appear unremarkable. The  urinary bladder is collapsed. Stomach/Bowel: Postsurgical changes of total colectomy with a right lower quadrant ileostomy. There is no bowel obstruction or active inflammation. Vascular/Lymphatic: Moderate aortoiliac atherosclerotic disease. The IVC is unremarkable. No portal venous gas. There is no adenopathy. Reproductive: The prostate gland is enlarged measuring 5.3 cm in transverse axial diameter. The seminal vesicles are symmetric. Other: None Musculoskeletal: Degenerative changes of the spine. No acute osseous pathology. IMPRESSION: 1. No acute intra-abdominal or pelvic pathology. 2. Cirrhosis. 3. Postsurgical changes of total colectomy with a right lower quadrant ileostomy. No bowel obstruction. 4.  Aortic Atherosclerosis (ICD10-I70.0). Electronically Signed   By: Anner Crete M.D.   On: 12/18/2022 22:49    Microbiology: Results for orders placed or performed during the hospital encounter of 10/25/22  Culture, blood (Routine X 2) w Reflex to ID Panel     Status: None   Collection Time: 10/25/22 11:54 PM   Specimen: BLOOD  Result Value Ref Range Status   Specimen Description BLOOD LEFT [facility]  Final   Special Requests   Final    BOTTLES DRAWN AEROBIC AND ANAEROBIC Blood Culture adequate volume   Culture   Final    NO GROWTH 5 DAYS Performed at [facility], [address] [place], [place] [address]    Report Status 10/31/2022 FINAL  Final  Culture, blood (Routine X 2) w Reflex to ID Panel     Status: None   Collection Time: 10/26/22 12:07 AM   Specimen: BLOOD  Result Value Ref Range Status   Specimen Description BLOOD RIGHT [facility]  Final   Special Requests   Final    BOTTLES DRAWN AEROBIC AND ANAEROBIC Blood Culture adequate volume   Culture   Final    NO GROWTH 5 DAYS Performed at [facility], [address] [place], [place] [address]    Report Status 10/31/2022 FINAL  Final    Labs: CBC: Recent Labs  Lab 12/18/22 2220 12/19/22 0555 12/20/22 0731  12/21/22 0613 12/21/22 1520 12/22/22 0413 12/22/22 2017 12/22/22 2355 12/23/22 0525  WBC 6.8  --  3.7* 3.2*  --  5.3 7.3  --  6.0  NEUTROABS 3.4  --   --   --   --   --  4.3  --   --   HGB 8.3*   < > 7.3* 6.5* 8.0* 8.7* 10.3* 8.7* 7.6*  HCT 25.5*  --  21.9* 19.5* 24.4* 26.2* 32.4* 26.3* 22.9*  MCV 97.3  --  92.0 92.4  --  92.3 98.5  --  93.9  PLT 130*  --  72* 62*  --  100* 130*  --  PLATELET CLUMPS NOTED ON SMEAR, UNABLE TO ESTIMATE   < > = values in this interval not displayed.   Basic Metabolic Panel: Recent Labs  Lab 12/20/22 0731 12/21/22  IT:[id number] 12/22/22 0413 12/22/22 2017 12/23/22 0525  NA 142 145 140 139 142  K 4.1 4.0 3.9 4.2 4.2  CL 122* 118* 117* 116* 116*  CO2 16* 18* 18* 17* 18*  GLUCOSE 155* 148* 166* 158* 154*  BUN 26* 22 19 19 20   CREATININE 1.39* 1.35* 1.30* 1.37* 1.43*  CALCIUM 8.2* 8.3* 8.4* 8.6* 8.6*   Liver Function Tests: Recent Labs  Lab 12/18/22 2220 12/22/22 2017 12/23/22 0525  AST 29 44* 37  ALT 18 26 19   ALKPHOS 80 79 50  BILITOT 0.6 0.7 0.8  PROT 5.0* 6.6 5.5*  ALBUMIN 2.6* 3.4* 2.8*   CBG: Recent Labs  Lab 12/21/22 1612 12/21/22 2037 12/22/22 0829 12/22/22 2250 12/23/22 0827  GLUCAP 253* 225* 177* 149* 145*    Discharge time spent: greater than 30 minutes.  This record has been created using Systems analyst. Errors have been sought and corrected,but may not always be located. Such creation errors do not reflect on the standard of care.   Signed: Lorella Nimrod, MD [facility] 12/23/2022

## 2022-12-23 NOTE — Consult Note (Signed)
SURGICAL CONSULTATION NOTE   HISTORY OF PRESENT ILLNESS (HPI):  72 y.o. male presented to [facility] ED for evaluation of bleeding from ileostomy.  Patient admitted recently from 12/18/2022 to 12/22/2022.  During that admission he was treated for symptomatic anemia.  When he presented to the ED they treated the bleeding from the ileostomy site with silver nitrate.  No more bleeding during the admission.  He was discharged with a hemoglobin of 8.7.  At the ED his hemoglobin was 10.3.  Then repeat hemoglobin was 8.7.  This morning hemoglobin 7.6.  During my evaluation this morning there was no blood in the bag or in the ileostomy.  As per patient there were a lot of clots yesterday.  No active bleeding at this moment.  Denies abdominal pain.  Surgery is consulted by Dr. Reesa Chew in this context for evaluation and management of bleeding from ileostomy.  PAST MEDICAL HISTORY (PMH):  Past Medical History:  Diagnosis Date   CHF (congestive heart failure) ([place])    Cirrhosis ([place])    Diabetes mellitus without complication ([place])    Esophageal varices (HCC)    GERD (gastroesophageal reflux disease)    Portal hypertension (HCC)    Ulcerative colitis ([place])      PAST SURGICAL HISTORY ([place]):  Past Surgical History:  Procedure Laterality Date   ESOPHAGOGASTRODUODENOSCOPY N/A 08/17/2021   Procedure: ESOPHAGOGASTRODUODENOSCOPY (EGD);  Surgeon: Annamaria Helling, DO;  Location: [facility] ENDOSCOPY;  Service: Gastroenterology;  Laterality: N/A;  IDDM   ESOPHAGOGASTRODUODENOSCOPY N/A 08/30/2022   Procedure: ESOPHAGOGASTRODUODENOSCOPY (EGD);  Surgeon: Annamaria Helling, DO;  Location: [facility] ENDOSCOPY;  Service: Gastroenterology;  Laterality: N/A;   ESOPHAGOGASTRODUODENOSCOPY (EGD) WITH PROPOFOL N/A 12/20/2022   Procedure: ESOPHAGOGASTRODUODENOSCOPY (EGD) WITH PROPOFOL;  Surgeon: [name], MD;  Location: [facility] ENDOSCOPY;  Service: Gastroenterology;  Laterality: N/A;   HERNIA REPAIR     ILEOSCOPY N/A  12/20/2022   Procedure: ILEOSCOPY THROUGH STOMA;  Surgeon: [name], MD;  Location: [facility] ENDOSCOPY;  Service: Gastroenterology;  Laterality: N/A;   TOTAL COLECTOMY     with end ileostomy     MEDICATIONS:  Prior to Admission medications   Medication Sig Start Date End Date Taking? Authorizing Provider  ferrous sulfate 325 (65 FE) MG tablet Take 325 mg by mouth every morning. 01/04/21  Yes [provider]  gabapentin (NEURONTIN) 300 MG capsule Take 300 mg by mouth 3 (three) times daily. 05/16/21  Yes [provider]  insulin aspart (NOVOLOG) 100 UNIT/ML FlexPen Inject 15 Units into the skin in the morning, at noon, and at bedtime.   Yes [provider]  Insulin Glargine (BASAGLAR KWIKPEN) 100 UNIT/ML Inject 30 Units into the skin See admin instructions. Inject 50u under the skin every morning and inject 40u under the skin every night Patient taking differently: Inject 40-50 Units into the skin See admin instructions. Inject 50u under the skin every morning and inject 40u under the skin every night 10/28/22  Yes Sharen Hones, MD  magnesium oxide (MAG-OX) 400 MG tablet Take 1 tablet by mouth 2 (two) times daily. 05/03/21  Yes [provider]  omeprazole (PRILOSEC) 40 MG capsule Take 40 mg by mouth daily. 05/26/21  Yes [provider]  propranolol (INDERAL) 10 MG tablet Take 10 mg by mouth 2 (two) times daily. 05/14/21  Yes [provider]  tamsulosin (FLOMAX) 0.4 MG CAPS capsule Take 1 capsule (0.4 mg total) by mouth daily. 11/12/22  Yes Billey Co, MD  sodium bicarbonate 650 MG tablet  Take 650 mg by mouth 4 (four) times daily. Patient not taking: Reported on 12/19/2022    [provider]     ALLERGIES:  Allergies  Allergen Reactions   Iodinated Contrast Media Rash and Anaphylaxis    body feels on fire   Tricor [Fenofibrate] Rash   Other Rash    Pt states the bed sheets at the hospital gave him a bad purple rash on his  back.   Oxycodone Hives     SOCIAL HISTORY:  Social History   Socioeconomic History   Marital status: Married    Spouse name: Not on file   Number of children: Not on file   Years of education: Not on file   Highest education level: Not on file  Occupational History   Not on file  Tobacco Use   Smoking status: Former    Types: Cigarettes    Passive exposure: Past   Smokeless tobacco: Never  Vaping Use   Vaping Use: Never used  Substance and Sexual Activity   Alcohol use: Yes    Comment: 3 beers in 4 months   Drug use: Never   Sexual activity: Not on file  Other Topics Concern   Not on file  Social History Narrative   Not on file   Social Determinants of Health   Financial Resource Strain: Not on file  Food Insecurity: No Food Insecurity (12/22/2022)   Hunger Vital Sign    Worried About Running Out of Food in the Last Year: Never true    Ran Out of Food in the Last Year: Never true  Transportation Needs: No Transportation Needs (12/19/2022)   PRAPARE - Hydrologist (Medical): No    Lack of Transportation (Non-Medical): No  Physical Activity: Not on file  Stress: Not on file  Social Connections: Not on file  Intimate Partner Violence: Not At Risk (12/22/2022)   Humiliation, Afraid, Rape, and Kick questionnaire    Fear of Current or Ex-Partner: No    Emotionally Abused: No    Physically Abused: No    Sexually Abused: No      FAMILY HISTORY:  History reviewed. No pertinent family history.   REVIEW OF SYSTEMS:  Constitutional: denies weight loss, fever, chills, or sweats  Eyes: denies any other vision changes, history of eye injury  ENT: denies sore throat, hearing problems  Respiratory: denies shortness of breath, wheezing  Cardiovascular: denies chest pain, palpitations  Gastrointestinal: Denies abdominal pain, nausea and vomiting Genitourinary: denies burning with urination or urinary frequency Musculoskeletal: denies any other  joint pains or cramps  Skin: denies any other rashes or skin discolorations  Neurological: denies any other headache, dizziness, weakness  Psychiatric: denies any other depression, anxiety   All other review of systems were negative   VITAL SIGNS:  Temp:  [97.5 F (36.4 C)-98.2 F (36.8 C)] 98 F (36.7 C) (03/24 1030) Pulse Rate:  [62-81] 62 (03/24 1030) Resp:  [16-20] 16 (03/24 1030) BP: (122-144)/(54-74) 144/64 (03/24 1030) SpO2:  [99 %-100 %] 100 % (03/24 1030) Weight:  [80.3 kg] 80.3 kg (03/23 2017)     Height: 5\' 10"  (177.8 cm) Weight: 80.3 kg BMI (Calculated): 25.4   INTAKE/OUTPUT:  This shift: Total I/O In: 580 [P.O.:240; Blood:340] Out: -   Last 2 shifts: @IOLAST2SHIFTS @   PHYSICAL EXAM:  Constitutional:  -- Normal body habitus  -- Awake, alert, and oriented x3  Eyes:  -- Pupils equally round and reactive to light  --  No scleral icterus  Ear, nose, and throat:  -- No jugular venous distension  Pulmonary:  -- No crackles  -- Equal breath sounds bilaterally -- Breathing non-labored at rest Cardiovascular:  -- S1, S2 present  -- No pericardial rubs Gastrointestinal:  -- Abdomen soft, nontender, non-distended, no guarding or rebound tenderness -- Ileostomy in the right lower quadrant without ulcers or active bleeding. Musculoskeletal and Integumentary:  -- Wounds: None appreciated -- Extremities: B/L UE and LE FROM, hands and feet warm, no edema  Neurologic:  -- Motor function: intact and symmetric -- Sensation: intact and symmetric   Labs:     Latest Ref Rng & Units 12/23/2022    5:25 AM 12/22/2022   11:55 PM 12/22/2022    8:17 PM  CBC  WBC 4.0 - 10.5 K/uL 6.0   7.3   Hemoglobin 13.0 - 17.0 g/dL 7.6  8.7  10.3   Hematocrit 39.0 - 52.0 % 22.9  26.3  32.4   Platelets 150 - 400 K/uL PLATELET CLUMPS NOTED ON SMEAR, UNABLE TO ESTIMATE   130       Latest Ref Rng & Units 12/23/2022    5:25 AM 12/22/2022    8:17 PM 12/22/2022    4:13 AM  CMP  Glucose 70 -  99 mg/dL 154  158  166   BUN 8 - 23 mg/dL 20  19  19    Creatinine 0.61 - 1.24 mg/dL 1.43  1.37  1.30   Sodium 135 - 145 mmol/L 142  139  140   Potassium 3.5 - 5.1 mmol/L 4.2  4.2  3.9   Chloride 98 - 111 mmol/L 116  116  117   CO2 22 - 32 mmol/L 18  17  18    Calcium 8.9 - 10.3 mg/dL 8.6  8.6  8.4   Total Protein 6.5 - 8.1 g/dL 5.5  6.6    Total Bilirubin 0.3 - 1.2 mg/dL 0.8  0.7    Alkaline Phos 38 - 126 U/L 50  79    AST 15 - 41 U/L 37  44    ALT 0 - 44 U/L 19  26      Imaging studies:  I personally evaluated the CT scan of the abdomen and pelvis on 12/18/2022.  No active bleeding was appreciated.  Assessment/Plan:  72 y.o. male with for recurrent bleeding from the ileostomy, complicated by pertinent comorbidities including thrombocytopenia, uncontrolled diabetes last hemoglobin A1c of 8.0 chronic kidney disease history of ulcerative colitis s/p total abdominal colectomy with end ileostomy creation.  -As per history patient with repeated bleeding through the ileostomy.  At the moment of my evaluation there was no active bleeding.  No blood clots in the bag.  Clean mucosa. -Patient will benefit of ostomy nurse evaluation and management to make sure that the patient has adequate ostomy supplies and size.  No surgical intervention at this moment.  If no further episode of bleeding, patient can be discharged after ostomy nurse evaluation and recommendations.  Arnold Long, MD

## 2022-12-23 NOTE — Hospital Course (Addendum)
Taken from H&P.  Alexander Cunningham is a 72 y.o. male with medical history significant for ulcerative colitis status post proctocolectomy and right lower quadrant ileostomy formation in 2016, type 2 diabetes, hypertension, BPH, who presents with bleeding from his ostomy site.   Patient was discharged earlier today after being admitted from March 19 to 23 for the same issue.  He reported during that admission that something similar it happened several years ago and required him to "have stitches placed".  During that admission there was concern for bleeding from multiple different etiologies but all of these were sequentially ruled out.  He underwent both an EGD and a colonoscopy.  EGD showed nodular mucosa in the duodenal bulb which was biopsied (pathology not yet back) and portal hypertensive gastropathy with small esophageal varices.  Colonoscopy was unremarkable.  CT scan during that admission did not show any abnormalities outside of cirrhosis.  General surgery and gastroenterology were both consulted.  Ultimately silver nitrate was applied to areas of concern around the ileostomy and no further bleeding was noted.  He also received 2 units packed RBCs.  At home patient certainly noticed a ostomy bag was getting full, when he went to empty it, found to be full of blood so he decided to return to ED.  In the ED vital signs were unremarkable.  CMP showed baseline CKD, mild hyperglycemia, overall no acute findings or changes from prior.  CBC showed hemoglobin of 10.3, improved from 8.7 earlier today.  Platelets were 130, also improved from 100 earlier today.  Type and screen were obtained, dressing with topical TXA was applied in the ED, and he was admitted for further management.   3/24: Hemodynamically stable.  Hemoglobin decreased to 7.6.  No more bleeding since this morning.  General surgery evaluated him and cleared him for discharge, there is nothing more to offer except coming back to ED for  recurrence of bleeding.  No emergent need for any surgical procedure.  Ordered 1 unit of PRBC.  Patient is being discharged after getting 1 unit.  No more active bleeding. Patient was instructed to come back if he experience more bleeding. No treatable reason found despite extensive evaluation by GI and general surgery.  Patient will be high risk for readmission.  He will continue on current medications and need to have a close follow-up with his providers for further recommendations.

## 2022-12-23 NOTE — Progress Notes (Signed)
Patient aware of discharge, discharge material and instruction provided to patient, all questions and concerns addressed at this time. Iv's removed with no complications. MD aware of hemoglobin and hematocrit. Patient reports no blood from ostomy pouch since this RN came onto shift. Patient escorted off unit by member of unit staff to private vehicle with wife.

## 2022-12-24 ENCOUNTER — Telehealth: Payer: Self-pay

## 2022-12-24 LAB — TYPE AND SCREEN
ABO/RH(D): O POS
Antibody Screen: NEGATIVE
Unit division: 0

## 2022-12-24 LAB — BPAM RBC
Blood Product Expiration Date: 202404292359
ISSUE DATE / TIME: 202403241024
Unit Type and Rh: 5100

## 2022-12-24 LAB — SURGICAL PATHOLOGY

## 2022-12-24 NOTE — Telephone Encounter (Signed)
Placed referral to [facility] GI Dr. Victorio Palm and faxed. Faxed demographics, Medication list, provider note from hospital, Colonoscopy and EGD report.

## 2022-12-24 NOTE — Telephone Encounter (Signed)
-----   Message from [name], MD sent at 12/24/2022 10:32 AM EDT ----- Regarding: Referral Please refer this patient to Dr. Victorio Palm at [facility], colorectal surgeon if patient is agreeable I saw him in the hospital for recurrent bleeding from ileostomy site  Alexander Cunningham

## 2023-11-11 ENCOUNTER — Other Ambulatory Visit: Payer: Self-pay | Admitting: Urology

## 2023-11-11 DIAGNOSIS — N138 Other obstructive and reflux uropathy: Secondary | ICD-10-CM

## 2023-12-09 ENCOUNTER — Other Ambulatory Visit: Payer: Self-pay | Admitting: Urology

## 2023-12-09 DIAGNOSIS — N138 Other obstructive and reflux uropathy: Secondary | ICD-10-CM

## 2023-12-19 ENCOUNTER — Other Ambulatory Visit: Payer: Self-pay | Admitting: Urology

## 2023-12-19 DIAGNOSIS — N138 Other obstructive and reflux uropathy: Secondary | ICD-10-CM
